# Patient Record
Sex: Female | Born: 1960 | Race: Black or African American | Hispanic: No | Marital: Married | Smoking: Never smoker
Health system: Southern US, Community
[De-identification: ages and names within clinical notes are randomized; demographics above are authoritative.]

## PROBLEM LIST (undated history)

## (undated) DIAGNOSIS — G35 Multiple sclerosis: Secondary | ICD-10-CM

## (undated) DIAGNOSIS — I10 Essential (primary) hypertension: Secondary | ICD-10-CM

## (undated) HISTORY — PX: ABDOMINAL HYSTERECTOMY: SHX81

---

## 2000-06-16 ENCOUNTER — Other Ambulatory Visit: Admission: RE | Admit: 2000-06-16 | Discharge: 2000-06-16 | Payer: Self-pay | Admitting: Obstetrics and Gynecology

## 2002-11-05 ENCOUNTER — Encounter: Payer: Self-pay | Admitting: Internal Medicine

## 2002-11-05 ENCOUNTER — Encounter: Admission: RE | Admit: 2002-11-05 | Discharge: 2002-11-05 | Payer: Self-pay | Admitting: Internal Medicine

## 2005-01-26 ENCOUNTER — Encounter: Admission: RE | Admit: 2005-01-26 | Discharge: 2005-01-26 | Payer: Self-pay | Admitting: Internal Medicine

## 2007-03-28 ENCOUNTER — Encounter: Admission: RE | Admit: 2007-03-28 | Discharge: 2007-03-28 | Payer: Self-pay | Admitting: Internal Medicine

## 2007-05-01 ENCOUNTER — Emergency Department (HOSPITAL_COMMUNITY): Admission: EM | Admit: 2007-05-01 | Discharge: 2007-05-01 | Payer: Self-pay | Admitting: Emergency Medicine

## 2011-09-23 ENCOUNTER — Other Ambulatory Visit: Payer: Self-pay | Admitting: Obstetrics and Gynecology

## 2011-09-23 DIAGNOSIS — Z1231 Encounter for screening mammogram for malignant neoplasm of breast: Secondary | ICD-10-CM

## 2011-10-12 ENCOUNTER — Ambulatory Visit
Admission: RE | Admit: 2011-10-12 | Discharge: 2011-10-12 | Disposition: A | Discharge status: Home / Self Care | Payer: 59 | Source: Ambulatory Visit | Attending: Obstetrics and Gynecology | Admitting: Obstetrics and Gynecology

## 2011-10-12 DIAGNOSIS — Z1231 Encounter for screening mammogram for malignant neoplasm of breast: Secondary | ICD-10-CM

## 2011-12-08 ENCOUNTER — Encounter: Payer: 59 | Admitting: Obstetrics and Gynecology

## 2012-09-11 ENCOUNTER — Encounter (HOSPITAL_COMMUNITY): Payer: Self-pay | Admitting: Emergency Medicine

## 2012-09-11 ENCOUNTER — Emergency Department (HOSPITAL_COMMUNITY)
Admission: EM | Admit: 2012-09-11 | Discharge: 2012-09-11 | Disposition: A | Discharge status: Home / Self Care | Payer: 59 | Attending: Emergency Medicine | Admitting: Emergency Medicine

## 2012-09-11 DIAGNOSIS — I10 Essential (primary) hypertension: Secondary | ICD-10-CM | POA: Insufficient documentation

## 2012-09-11 DIAGNOSIS — R51 Headache: Secondary | ICD-10-CM | POA: Insufficient documentation

## 2012-09-11 HISTORY — DX: Essential (primary) hypertension: I10

## 2012-09-11 HISTORY — DX: Multiple sclerosis: G35

## 2012-09-11 NOTE — ED Notes (Signed)
Pt states that she will just go to an UC.  Pt's BP was normal at triage as documented

## 2012-09-11 NOTE — ED Notes (Signed)
Pt complains of "my blood pressure has been elevated today". Pt also complains of a headache at this time.

## 2012-09-13 ENCOUNTER — Encounter: Payer: Self-pay | Admitting: *Deleted

## 2012-09-13 ENCOUNTER — Ambulatory Visit (INDEPENDENT_AMBULATORY_CARE_PROVIDER_SITE_OTHER): Payer: 59 | Admitting: Family Medicine

## 2012-09-13 ENCOUNTER — Encounter: Payer: Self-pay | Admitting: Family Medicine

## 2012-09-13 VITALS — BP 150/92 | HR 85 | Temp 98.8°F | Ht 67.0 in | Wt 216.0 lb

## 2012-09-13 DIAGNOSIS — G35 Multiple sclerosis: Secondary | ICD-10-CM

## 2012-09-13 DIAGNOSIS — I10 Essential (primary) hypertension: Secondary | ICD-10-CM

## 2012-09-13 DIAGNOSIS — Z79899 Other long term (current) drug therapy: Secondary | ICD-10-CM

## 2012-09-13 DIAGNOSIS — Z13220 Encounter for screening for lipoid disorders: Secondary | ICD-10-CM

## 2012-09-13 LAB — HEPATIC FUNCTION PANEL
Albumin: 4 g/dL (ref 3.5–5.2)
Total Bilirubin: 0.2 mg/dL — ABNORMAL LOW (ref 0.3–1.2)

## 2012-09-13 LAB — BASIC METABOLIC PANEL
BUN: 11 mg/dL (ref 6–23)
Calcium: 9.1 mg/dL (ref 8.4–10.5)
Creatinine, Ser: 1 mg/dL (ref 0.4–1.2)

## 2012-09-13 LAB — LDL CHOLESTEROL, DIRECT: Direct LDL: 110.5 mg/dL

## 2012-09-13 MED ORDER — LISINOPRIL-HYDROCHLOROTHIAZIDE 10-12.5 MG PO TABS: 1.0000 | ORAL_TABLET | Freq: Every day | ORAL | Status: DC

## 2012-09-13 NOTE — Progress Notes (Signed)
Nature conservation officer at [facility] [address] [place] [address] Phone: [phone] Fax: [phone]  Date:  09/13/2012   Name:  Brandi Baldwin   DOB:  1961/04/05   MRN:  [id number] Gender: female Age: 51 y.o.  PCP:  Hannah Beat, MD  Evaluating MD: Hannah Beat, MD   Chief Complaint: Establish Care   History of Present Illness:  Brandi Baldwin is a 51 y.o. pleasant patient who presents with the following:  Was on blood pressure meds before hctz/lisinopril.  She has run out of her medication, now her blood pressure has increased. She has not had any headaches, nausea, visual changes or other problems. She generally is feeling well.  She has not had any recent MS flares.  Dr. Izola Price in [place].    Patient Active Problem List  Diagnosis  . Hypertension  . Multiple sclerosis    Past Medical History  Diagnosis Date  . Hypertension   . Multiple sclerosis     Past Surgical History  Procedure Date  . Abdominal hysterectomy Partial    History  Substance Use Topics  . Smoking status: Never Smoker   . Smokeless tobacco: Not on file  . Alcohol Use: Yes    No family history on file.  No Known Allergies  Current Outpatient Prescriptions on File Prior to Visit  Medication Sig Dispense Refill  . baclofen (LIORESAL) 10 MG tablet Take 10 mg by mouth 2 (two) times daily.      . interferon beta-1a (REBIF REBIDOSE) 44 MCG/0.5ML injection Inject 44 mcg into the skin 3 (three) times a week.      Marland Kitchen VIVELLE-DOT 0.05 MG/24HR       . lisinopril-hydrochlorothiazide (PRINZIDE,ZESTORETIC) 10-12.5 MG per tablet Take 1 tablet by mouth daily.  30 tablet  11     Review of Systems:   GEN: No acute illnesses, no fevers, chills. GI: No n/v/d, eating normally Pulm: No SOB Interactive and getting along well at home.  Otherwise, ROS is as per the HPI.   Physical Examination: Filed Vitals:   09/13/12 1400  BP: 150/92  Pulse: 85  Temp:  98.8 F (37.1 C)  TempSrc: Oral  Height: 5\' 7"  (1.702 m)  Weight: 216 lb (97.977 kg)  SpO2: 98%    Body mass index is 33.83 kg/(m^2). Ideal Body Weight: Weight in (lb) to have BMI = 25: 159.3    GEN: WDWN, NAD, Non-toxic, A & O x 3 HEENT: Atraumatic, Normocephalic. Neck supple. No masses, No LAD. Ears and Nose: No external deformity. CV: RRR, No M/G/R. No JVD. No thrill. No extra heart sounds. PULM: CTA B, no wheezes, crackles, rhonchi. No retractions. No resp. distress. No accessory muscle use. EXTR: No c/c/e NEURO Normal gait.  PSYCH: Normally interactive. Conversant. Not depressed or anxious appearing.  Calm demeanor.    Assessment and Plan:  1. Multiple sclerosis  Ambulatory referral to Neurology  2. Hypertension    3. Screening for lipoid disorders  LDL cholesterol, direct  4. Encounter for long-term (current) use of other medications  Basic metabolic panel, Hepatic function panel   Consult [facility] for their long-term assistance with management with multiple sclerosis.  Start lisinopril/hydrochlorothiazide at the low dosing. 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 Basic metabolic panel  . LDL cholesterol,  direct  . Hepatic function panel  . Ambulatory referral to Neurology    Referral Priority:  Routine    Referral Type:  Consultation    Referral Reason:  Specialty Services Required    Requested Specialty:  Neurology    Number of Visits Requested:  1    Updated Medication List: (Includes new medications, updates to list, dose adjustments) Meds ordered this encounter  Medications  . VIVELLE-DOT 0.05 MG/24HR    Sig:   . aspirin 81 MG tablet    Sig: Take 81 mg by mouth daily.  Marland Kitchen lisinopril-hydrochlorothiazide (PRINZIDE,ZESTORETIC) 10-12.5 MG per tablet    Sig: Take 1 tablet by mouth daily.    Dispense:   30 tablet    Refill:  11    Medications Discontinued: There are no discontinued medications.   Hannah Beat, MD

## 2012-09-13 NOTE — Patient Instructions (Addendum)
REFERRAL: GO THE THE FRONT ROOM AT THE ENTRANCE OF OUR CLINIC, NEAR CHECK IN. ASK FOR MARION. SHE WILL HELP YOU SET UP YOUR REFERRAL. DATE: TIME:  

## 2012-09-15 ENCOUNTER — Encounter: Payer: Self-pay | Admitting: Family Medicine

## 2012-09-15 DIAGNOSIS — I10 Essential (primary) hypertension: Secondary | ICD-10-CM | POA: Insufficient documentation

## 2012-09-15 DIAGNOSIS — G35 Multiple sclerosis: Secondary | ICD-10-CM | POA: Insufficient documentation

## 2012-09-18 ENCOUNTER — Encounter: Payer: Self-pay | Admitting: Obstetrics and Gynecology

## 2012-09-18 ENCOUNTER — Ambulatory Visit (INDEPENDENT_AMBULATORY_CARE_PROVIDER_SITE_OTHER): Payer: 59 | Admitting: Obstetrics and Gynecology

## 2012-09-18 VITALS — BP 120/68 | HR 90 | Ht 65.5 in | Wt 212.0 lb

## 2012-09-18 DIAGNOSIS — Z90710 Acquired absence of both cervix and uterus: Secondary | ICD-10-CM | POA: Insufficient documentation

## 2012-09-18 DIAGNOSIS — N951 Menopausal and female climacteric states: Secondary | ICD-10-CM

## 2012-09-18 DIAGNOSIS — R3915 Urgency of urination: Secondary | ICD-10-CM

## 2012-09-18 DIAGNOSIS — Z01419 Encounter for gynecological examination (general) (routine) without abnormal findings: Secondary | ICD-10-CM

## 2012-09-18 DIAGNOSIS — R232 Flushing: Secondary | ICD-10-CM

## 2012-09-18 MED ORDER — ESTRADIOL 0.1 MG/GM VA CREA: TOPICAL_CREAM | VAGINAL | Status: DC

## 2012-09-18 NOTE — Progress Notes (Signed)
Subjective:  Last Pap: no longer needed WNL: n/a Regular Periods:no Contraception: partial hysterectomy   Monthly Breast exam:yes Tetanus<35yrs:yes Nl.Bladder Function:yes Daily BMs:yes Healthy Diet:no Calcium:no Mammogram:yes Date of Mammogram: 10/12/11 Exercise:no Have often Exercise: n/a Seatbelt: yes Abuse at home: no Stressful work:yes Sigmoid-colonoscopy: Pt has one scheduled  Bone Density: No PCP: Dr. Patsy Lager Change in PMH: none Change in ZOX:WRUE   Brandi Baldwin is a 51 y.o. female No obstetric history on file. who presents for annual exam.. She has chronic urinary urgency, but rarely has incontinence, thought to be a manifestation of Multiple Sclerosis.  Otherwise MS has been stable.  Hot flashes are improved with vivelle dot, but she wants to change meds because the patch doesn't stay in place  The following portions of the patient's history were reviewed and updated as appropriate: allergies, current medications, past family history, past medical history, past social history, past surgical history and problem list.  Review of Systems Pertinent items are noted in HPI. Gastrointestinal:No change in bowel habits, no abdominal pain, no rectal bleeding Genitourinary:negative for dysuria, frequency, hematuria, nocturia and urinary incontinence    Objective:     BP 120/68  Pulse 90  Ht 5' 5.5" (1.664 m)  Wt 212 lb (96.163 kg)  BMI 34.74 kg/m2  Weight:  Wt Readings from Last 1 Encounters:  09/18/12 212 lb (96.163 kg)     BMI: Body mass index is 34.74 kg/(m^2). General Appearance: Alert, appropriate appearance for age. No acute distress HEENT: Grossly normal Neck / Thyroid: Supple, no masses, nodes or enlargement Lungs: clear to auscultation bilaterally Back: No CVA tenderness Breast Exam: No masses or nodes.No dimpling, nipple retraction or discharge. Cardiovascular: Regular rate and rhythm. S1, S2, no murmur Gastrointestinal: Soft, non-tender, no masses or  organomegaly Pelvic Exam: External genitalia: normal general appearance Urinary system: urethral meatus normal Vaginal: normal rugae Cervix: removed surgically Adnexa: no masses Uterus: removed surgically Rectovaginal: normal rectal, no masses Lymphatic Exam: Non-palpable nodes in neck, clavicular, axillary, or inguinal regions Skin: no rash or abnormalities Neurologic: Normal gait and speech, no tremor  Psychiatric: Alert and oriented, appropriate affect.    Urinalysis:normal and Not done    Assessment:    Hormone replacement therapy MS with urinary urgency; r/o estrogen deficinecy    Plan:   mammogram return annually or prn Sample of Elestrin given to allow pt to see if hot flashes are well controled with this med.  Pt aware that insurance may not cover.  She will decide how she likes it an let us know if she wants it prescribed. rx Estradiol vaginal cream to try to improve urgency.   Dierdre Forth MD

## 2013-01-04 ENCOUNTER — Encounter: Payer: Self-pay | Admitting: Family Medicine

## 2013-09-18 ENCOUNTER — Other Ambulatory Visit: Payer: Self-pay | Admitting: Family Medicine

## 2013-11-23 ENCOUNTER — Other Ambulatory Visit: Payer: Self-pay | Admitting: Family Medicine

## 2013-11-24 NOTE — Telephone Encounter (Signed)
Last office visit 09/13/2012.  Ok to refill?

## 2013-12-24 ENCOUNTER — Other Ambulatory Visit: Payer: Self-pay | Admitting: Family Medicine

## 2013-12-24 NOTE — Telephone Encounter (Signed)
Ok to refill 30, 2 refills, f/u CPX

## 2013-12-24 NOTE — Telephone Encounter (Signed)
Last office 09/13/2012.  Ok to refill?

## 2014-03-21 ENCOUNTER — Other Ambulatory Visit: Payer: Self-pay | Admitting: Family Medicine

## 2014-03-28 ENCOUNTER — Other Ambulatory Visit: Payer: Self-pay

## 2014-03-28 NOTE — Telephone Encounter (Signed)
Pt left v/m requesting refill lisinopril/HCTZ 10-12.5 mg to [facility]. Pt has CPX scheduled 05/21/14 but last seen 09/13/2012.Please advise.

## 2014-03-30 MED ORDER — LISINOPRIL-HYDROCHLOROTHIAZIDE 10-12.5 MG PO TABS
ORAL_TABLET | ORAL | Status: DC
Start: ? — End: 2014-06-27

## 2014-05-07 ENCOUNTER — Other Ambulatory Visit: Payer: Self-pay | Admitting: Family Medicine

## 2014-05-07 DIAGNOSIS — R5383 Other fatigue: Secondary | ICD-10-CM

## 2014-05-07 DIAGNOSIS — R5381 Other malaise: Secondary | ICD-10-CM

## 2014-05-07 DIAGNOSIS — Z13220 Encounter for screening for lipoid disorders: Secondary | ICD-10-CM

## 2014-05-14 ENCOUNTER — Other Ambulatory Visit (INDEPENDENT_AMBULATORY_CARE_PROVIDER_SITE_OTHER): Payer: 59

## 2014-05-14 ENCOUNTER — Other Ambulatory Visit: Payer: 59

## 2014-05-14 DIAGNOSIS — Z13220 Encounter for screening for lipoid disorders: Secondary | ICD-10-CM

## 2014-05-14 DIAGNOSIS — E559 Vitamin D deficiency, unspecified: Secondary | ICD-10-CM

## 2014-05-14 DIAGNOSIS — R5383 Other fatigue: Secondary | ICD-10-CM

## 2014-05-14 DIAGNOSIS — R5381 Other malaise: Secondary | ICD-10-CM

## 2014-05-14 DIAGNOSIS — I10 Essential (primary) hypertension: Secondary | ICD-10-CM

## 2014-05-14 LAB — HEPATIC FUNCTION PANEL
ALBUMIN: 4.2 g/dL (ref 3.5–5.2)
ALK PHOS: 63 U/L (ref 39–117)
ALT: 18 U/L (ref 0–35)
AST: 19 U/L (ref 0–37)
BILIRUBIN TOTAL: 0.4 mg/dL (ref 0.2–1.2)
Bilirubin, Direct: 0 mg/dL (ref 0.0–0.3)
Total Protein: 7.7 g/dL (ref 6.0–8.3)

## 2014-05-14 LAB — LIPID PANEL
CHOL/HDL RATIO: 3
Cholesterol: 161 mg/dL (ref 0–200)
HDL: 49.9 mg/dL (ref >39.00)
LDL Cholesterol: 85 mg/dL (ref 0–99)
NONHDL: 111.1
Triglycerides: 130 mg/dL (ref 0.0–149.0)
VLDL: 26 mg/dL (ref 0.0–40.0)

## 2014-05-14 LAB — CBC WITH DIFFERENTIAL/PLATELET
BASOS ABS: 0.1 10*3/uL (ref 0.0–0.1)
Basophils Relative: 0.8 % (ref 0.0–3.0)
Eosinophils Absolute: 0.3 10*3/uL (ref 0.0–0.7)
Eosinophils Relative: 4.9 % (ref 0.0–5.0)
HCT: 37.6 % (ref 36.0–46.0)
HEMOGLOBIN: 12.6 g/dL (ref 12.0–15.0)
LYMPHS PCT: 49.1 % — AB (ref 12.0–46.0)
Lymphs Abs: 3.3 10*3/uL (ref 0.7–4.0)
MCHC: 33.5 g/dL (ref 30.0–36.0)
MCV: 90.4 fl (ref 78.0–100.0)
MONOS PCT: 6.1 % (ref 3.0–12.0)
Monocytes Absolute: 0.4 10*3/uL (ref 0.1–1.0)
NEUTROS ABS: 2.6 10*3/uL (ref 1.4–7.7)
Neutrophils Relative %: 39.1 % — ABNORMAL LOW (ref 43.0–77.0)
Platelets: 279 10*3/uL (ref 150.0–400.0)
RBC: 4.16 Mil/uL (ref 3.87–5.11)
RDW: 13.1 % (ref 11.5–15.5)
WBC: 6.8 10*3/uL (ref 4.0–10.5)

## 2014-05-14 LAB — TSH: TSH: 2.11 u[IU]/mL (ref 0.35–4.50)

## 2014-05-14 LAB — BASIC METABOLIC PANEL
BUN: 11 mg/dL (ref 6–23)
CHLORIDE: 103 meq/L (ref 96–112)
CO2: 26 meq/L (ref 19–32)
CREATININE: 1.1 mg/dL (ref 0.4–1.2)
Calcium: 9.3 mg/dL (ref 8.4–10.5)
GFR: 70.2 mL/min (ref >60.00)
GLUCOSE: 89 mg/dL (ref 70–99)
Potassium: 3.9 mEq/L (ref 3.5–5.1)
Sodium: 137 mEq/L (ref 135–145)

## 2014-05-14 LAB — VITAMIN D 25 HYDROXY (VIT D DEFICIENCY, FRACTURES): VITD: 30.58 ng/mL (ref 30.00–100.00)

## 2014-05-21 ENCOUNTER — Encounter: Payer: Self-pay | Admitting: Family Medicine

## 2014-05-21 ENCOUNTER — Ambulatory Visit (INDEPENDENT_AMBULATORY_CARE_PROVIDER_SITE_OTHER): Payer: 59 | Admitting: Family Medicine

## 2014-05-21 VITALS — BP 130/72 | HR 102 | Temp 98.8°F | Ht 65.5 in | Wt 202.5 lb

## 2014-05-21 DIAGNOSIS — Z0000 Encounter for general adult medical examination without abnormal findings: Secondary | ICD-10-CM

## 2014-05-21 DIAGNOSIS — G35 Multiple sclerosis: Secondary | ICD-10-CM

## 2014-05-21 DIAGNOSIS — Z23 Encounter for immunization: Secondary | ICD-10-CM

## 2014-05-21 MED ORDER — BACLOFEN 10 MG PO TABS: 10.0000 mg | ORAL_TABLET | Freq: Two times a day (BID) | ORAL | Status: DC

## 2014-05-21 NOTE — Progress Notes (Signed)
05/21/2014    ID: MEEYAH OVITT   MRN: [id number]  DOB: 1961/07/24  Primary Physician:  Owens Loffler, MD  Chief Complaint: Annual Exam  Subjective:   History of Present Illness:  Brandi Baldwin is a 53 y.o. pleasant patient who presents with the following:  Health Maintenance Summary Reviewed and updated, unless pt declines services.  Tobacco History Reviewed. Non-smoker Alcohol: No concerns, no excessive use Exercise Habits: Intermittent, but working a lot STD concerns: none Drug Use: None Birth control method: s/p hyst Menses regular: yes Lumps or breast concerns: no Breast Cancer Family History: no  Overall, she is doing well.  Health Maintenance  Topic Date Due  . Mammogram  10/11/2013  . Influenza Vaccine  05/10/2014  . Pap Smear  04/09/2017  . Colonoscopy  10/10/2022  . Tetanus/tdap  05/21/2024    Immunization History  Administered Date(s) Administered  . Tdap 05/21/2014   Patient Active Problem List   Diagnosis Date Noted  . Obesity (BMI 30-39.9) 05/22/2014  . S/P hysterectomy 09/18/2012  . Hypertension 09/15/2012  . Multiple sclerosis    Past Medical History  Diagnosis Date  . Hypertension   . Multiple sclerosis    Past Surgical History  Procedure Laterality Date  . Abdominal hysterectomy  Partial 1991   History   Social History  . Marital Status: Married    Spouse Name: N/A    Number of Children: N/A  . Years of Education: N/A   Occupational History  . Consulting civil engineer, RN.    Social History Main Topics  . Smoking status: Never Smoker   . Smokeless tobacco: Not on file  . Alcohol Use: Yes     Comment: socially   . Drug Use: No  . Sexual Activity: Yes    Birth Control/ Protection: Surgical     Comment: partial hysterectomy    Other Topics Concern  . Not on file   Social History Narrative   Licensed conveyancer for [employer]   No family history on file. Allergies  Allergen Reactions  . Amitriptyline    hallucinations    Medication list has been reviewed and updated. Outpatient Encounter Prescriptions as of 05/21/2014  Medication Sig  . aspirin 81 MG tablet Take 81 mg by mouth daily.  . baclofen (LIORESAL) 10 MG tablet Take 1 tablet (10 mg total) by mouth 2 (two) times daily.  Marland Kitchen estradiol (ESTRACE VAGINAL) 0.1 MG/GM vaginal cream 1 gram vaginally at bedtime 2 times weekly  . interferon beta-1a (REBIF REBIDOSE) 44 MCG/0.5ML injection Inject 44 mcg into the skin 3 (three) times a week.  Marland Kitchen lisinopril-hydrochlorothiazide (PRINZIDE,ZESTORETIC) 10-12.5 MG per tablet TAKE 1 TABLET BY MOUTH DAILY.  . valACYclovir (VALTREX) 1000 MG tablet Take 1 tablet by mouth daily.  Marland Kitchen VIVELLE-DOT 0.05 MG/24HR   . [DISCONTINUED] baclofen (LIORESAL) 10 MG tablet Take 10 mg by mouth 2 (two) times daily.    Review of Systems:  General: Denies fever, chills, sweats. No significant weight loss. Eyes: Denies blurring,significant itching ENT: Denies earache, sore throat, and hoarseness.  Cardiovascular: Denies chest pains, palpitations, dyspnea on exertion,  Respiratory: Denies cough, dyspnea at rest,wheeezing Breast: no concerns about lumps GI: Denies nausea, vomiting, diarrhea, constipation, change in bowel habits, abdominal pain, melena, hematochezia GU: Denies dysuria, hematuria, urinary hesitancy, nocturia, denies STD risk, no concerns about discharge Musculoskeletal: Denies back pain, joint pain Derm: Denies rash, itching Neuro: Denies  paresthesias, frequent falls, frequent headaches Psych: Denies depression, anxiety  Endocrine: Denies cold intolerance, heat intolerance, polydipsia Heme: Denies enlarged lymph nodes Allergy: No hayfever  Objective:   Physical Examination: BP 130/72  Pulse 102  Temp(Src) 98.8 F (37.1 C) (Oral)  Ht 5' 5.5" (1.664 m)  Wt 202 lb 8 oz (91.853 kg)  BMI 33.17 kg/m2  SpO2 98% No exam data present  GEN: well developed, well nourished, no acute distress Eyes:  conjunctiva and lids normal, PERRLA, EOMI ENT: TM clear, nares clear, oral exam WNL Neck: supple, no lymphadenopathy, no thyromegaly, no JVD Pulm: clear to auscultation and percussion, respiratory effort normal CV: regular rate and rhythm, S1-S2, no murmur, rub or gallop, no bruits Chest: no scars, masses, no lumps BREAST: breast exam declined GI: soft, non-tender; no hepatosplenomegaly, masses; active bowel sounds all quadrants GU: GU exam declined Lymph: no cervical, axillary or inguinal adenopathy MSK: gait normal, muscle tone and strength WNL, no joint swelling, effusions, discoloration, crepitus  SKIN: clear, good turgor, color WNL, no rashes, lesions, or ulcerations Neuro: normal mental status, normal strength, sensation, and motion Psych: alert; oriented to person, place and time, normally interactive and not anxious or depressed in appearance.   All labs reviewed with patient. Lipids:    Component Value Date/Time   CHOL 161 05/14/2014 0839   TRIG 130.0 05/14/2014 0839   HDL 49.90 05/14/2014 0839   LDLDIRECT 110.5 09/13/2012 1425   VLDL 26.0 05/14/2014 0839   CHOLHDL 3 05/14/2014 0839   CBC: CBC Latest Ref Rng 05/14/2014  WBC 4.0 - 10.5 K/uL 6.8  Hemoglobin 12.0 - 15.0 g/dL 12.6  Hematocrit 36.0 - 46.0 % 37.6  Platelets 150.0 - 400.0 K/uL 789.3    Basic Metabolic Panel:    Component Value Date/Time   NA 137 05/14/2014 0839   K 3.9 05/14/2014 0839   CL 103 05/14/2014 0839   CO2 26 05/14/2014 0839   BUN 11 05/14/2014 0839   CREATININE 1.1 05/14/2014 0839   GLUCOSE 89 05/14/2014 0839   CALCIUM 9.3 05/14/2014 0839   Hepatic Function Latest Ref Rng 05/14/2014 09/13/2012  Total Protein 6.0 - 8.3 g/dL 7.7 7.7  Albumin 3.5 - 5.2 g/dL 4.2 4.0  AST 0 - 37 U/L 19 25  ALT 0 - 35 U/L 18 25  Alk Phosphatase 39 - 117 U/L 63 70  Total Bilirubin 0.2 - 1.2 mg/dL 0.4 0.2(L)  Bilirubin, Direct 0.0 - 0.3 mg/dL 0.0 0.0    Lab Results  Component Value Date   TSH 2.11 05/14/2014   No results  found.  Assessment & Plan:   Multiple sclerosis - Plan: Ambulatory referral to Neurology  Need for Tdap vaccination - Plan: Tdap vaccine greater than or equal to 7yo IM  Health Maintenance Exam: The patient's preventative maintenance and recommended screening tests for an annual wellness exam were reviewed in full today. Brought up to date unless services declined.  Counselled on the importance of diet, exercise, and its role in overall health and mortality. The patient's FH and SH was reviewed, including their home life, tobacco status, and drug and alcohol status.  She stopped her MS medications - needs new long-term neurologist.   Follow-up: No Follow-up on file. Or follow-up in 1 year for complete physical examination  Modified Medications   Modified Medication Previous Medication   BACLOFEN (LIORESAL) 10 MG TABLET baclofen (LIORESAL) 10 MG tablet      Take 1 tablet (10 mg total) by mouth 2 (two) times daily.    Take 10 mg by mouth 2 (two)  times daily.   Orders Placed This Encounter  Procedures  . Tdap vaccine greater than or equal to 7yo IM  . Ambulatory referral to Neurology    Signed,  Frederico Hamman T. Jacobus Colvin, MD, [facility] [place], [address] Phone: [phone] Fax: [phone]

## 2014-05-21 NOTE — Patient Instructions (Signed)

## 2014-05-21 NOTE — Progress Notes (Signed)
Pre visit review using our clinic review tool, if applicable. No additional management support is needed unless otherwise documented below in the visit note. 

## 2014-05-22 ENCOUNTER — Encounter: Payer: Self-pay | Admitting: Family Medicine

## 2014-05-22 DIAGNOSIS — E669 Obesity, unspecified: Secondary | ICD-10-CM | POA: Insufficient documentation

## 2014-06-27 ENCOUNTER — Other Ambulatory Visit: Payer: Self-pay | Admitting: Family Medicine

## 2014-07-09 ENCOUNTER — Encounter: Payer: Self-pay | Admitting: Neurology

## 2014-07-09 ENCOUNTER — Ambulatory Visit (INDEPENDENT_AMBULATORY_CARE_PROVIDER_SITE_OTHER): Payer: 59 | Admitting: Neurology

## 2014-07-09 VITALS — BP 110/78 | HR 80 | Ht 66.54 in | Wt 204.4 lb

## 2014-07-09 DIAGNOSIS — N318 Other neuromuscular dysfunction of bladder: Secondary | ICD-10-CM

## 2014-07-09 DIAGNOSIS — M62838 Other muscle spasm: Secondary | ICD-10-CM

## 2014-07-09 DIAGNOSIS — G35 Multiple sclerosis: Secondary | ICD-10-CM | POA: Insufficient documentation

## 2014-07-09 DIAGNOSIS — N3281 Overactive bladder: Secondary | ICD-10-CM | POA: Insufficient documentation

## 2014-07-09 MED ORDER — FESOTERODINE FUMARATE ER 4 MG PO TB24: 4.0000 mg | ORAL_TABLET | Freq: Every day | ORAL | Status: DC

## 2014-07-09 MED ORDER — BACLOFEN 10 MG PO TABS: 10.0000 mg | ORAL_TABLET | Freq: Three times a day (TID) | ORAL | Status: DC

## 2014-07-09 NOTE — Progress Notes (Signed)
NEUROLOGY CONSULTATION NOTE  Caro HightDebbie J Reno MRN: [id number] DOB: 01-13-1961  Referring provider: Dr. Patsy Lageropland Primary care provider: Dr. Patsy Lageropland  Reason for consult:  MS  HISTORY OF PRESENT ILLNESS: Tedra SenegalDebbie Sole is a 53 year old right-handed woman with history of multiple sclerosis and hypertension who presents to establish care regarding multiple sclerosis.  There are no records available to review at this time.  In 2009-2010, she developed an episode of vertigo lasting for 2 days, which spontaneously resolved.  In 2011, she developed sudden onset blindness in the left eye, associated with eye pain.  Soon afterwards, she developed sudden onset double vision, associated with incoordinating, gait instability and weakness of the left leg.  She was concerned about a stroke, so she went to an outside hospital, where MRI of the brain was reportedly suspicious for demyelinating disease.  MRI of the cervical spine was reportedly unremarkable.  She underwent further workup, including checking for HIV, which was negative.  She did have an LP, but is not sure of the results.  She was treated with Solumedrol.  She [place] up with [place] neurolog[place] [facility].  He told her that it is not definite that she has MS.  A disease-modifying agent was not started at that time.    Since that time, she has residual symptoms of unsteadiness, sometimes her left leg gives out.  She also has painful muscle spasms in the toes and calves.  She does feel fatigued, although it is usually not debilitating.  She has had to stay home from work a couple of times due to the fatigue.  She does have an overactive bladder and has had episodes of urinary incontinence on occasion.  She occasionally has neck tightness that causes aching pain.  She notes that her fingers sometimes move unintentionally, but she is able to suppress it when she is aware it is happening.  Symptoms do not seem exacerbated particular time of  year.  She subsequently moved to [place] in 2012, where she had exacerbation of the unsteadiness and left leg weakness.  She established care with a neurologist there.  She received another round of Solumedrol and was then started her on Rebif.  She has not required further steroids.  [place] eventually [place] back [place] and has been off Rebif for 6 months.  Currently, she takes Rebif as a disease-modifying agent and baclofen 10mg  twice daily for muscle spasms, which is still a problem.  Other medications include aspirin 81mg , estradiol, Lisinopril-hydrochlorothiazide, and valacyclovir.  Prior medications included Tegretol for spasms, which only caused sleepiness.  She was previously on vitamin D for vitamin D deficiency.  She has no family history of MS.  She is a Engineer, civil (consulting)nurse.  05/14/14 LABS:  Vit D 25-hydroxy 30.58, WBC 6.8, HGB 12.6, HCT 37.6, PLT 279, Na 137, K 3.9, Cl 103, CO2 26, glucose 89, BUN 11, Cr 1.1, Ca 9.3, TB 0.4, DB 0, ALP 63, AST 19, ALT 18, TP 7.7, and albumin 4.2.  PAST MEDICAL HISTORY: Past Medical History  Diagnosis Date  . Hypertension   . Multiple sclerosis     PAST SURGICAL HISTORY: Past Surgical History  Procedure Laterality Date  . Abdominal hysterectomy  Partial 1991    MEDICATIONS: Current Outpatient Prescriptions on File Prior to Visit  Medication Sig Dispense Refill  . aspirin 81 MG tablet Take 81 mg by mouth daily.      Marland Kitchen. estradiol (ESTRACE VAGINAL) 0.1 MG/GM vaginal cream 1 gram vaginally  at bedtime 2 times weekly  42.5 g  12  . interferon beta-1a (REBIF REBIDOSE) 44 MCG/0.5ML injection Inject 44 mcg into the skin 3 (three) times a week.      Marland Kitchen lisinopril-hydrochlorothiazide (PRINZIDE,ZESTORETIC) 10-12.5 MG per tablet TAKE 1 TABLET BY MOUTH DAILY.  30 tablet  5  . valACYclovir (VALTREX) 1000 MG tablet Take 1 tablet by mouth daily.      Marland Kitchen VIVELLE-DOT 0.05 MG/24HR        No current facility-administered medications on file prior to visit.     ALLERGIES: Allergies  Allergen Reactions  . Amitriptyline     hallucinations    FAMILY HISTORY: Family History  Problem Relation Age of Onset  . Kidney disease Mother   . Hypertension Mother   . Cancer Brother     SOCIAL HISTORY: History   Social History  . Marital Status: Married    Spouse Name: N/A    Number of Children: N/A  . Years of Education: N/A   Occupational History  . Probation officer, RN.    Social History Main Topics  . Smoking status: Never Smoker   . Smokeless tobacco: Not on file  . Alcohol Use: Yes     Comment: socially   . Drug Use: No  . Sexual Activity: Yes    Birth Control/ Protection: Surgical     Comment: partial hysterectomy    Other Topics Concern  . Not on file   Social History Narrative   Consulting civil engineer for [employer]      GYN Care at [facility]             REVIEW OF SYSTEMS: Constitutional: Fatigue.  No fevers, chills, or sweats, change in appetite Eyes: No visual changes, double vision, eye pain Ear, nose and throat: No hearing loss, ear pain, nasal congestion, sore throat Cardiovascular: No chest pain, palpitations Respiratory:  No shortness of breath at rest or with exertion, wheezes GastrointestinaI: No nausea, vomiting, diarrhea, abdominal pain, fecal incontinence Genitourinary:  Increase urinary frequency. Musculoskeletal:  Muscle spasms in the calves and toes.  No neck pain, back pain Integumentary: No rash, pruritus, skin lesions Neurological: as above Psychiatric: No depression, insomnia, anxiety Endocrine: No palpitations, fatigue, diaphoresis, mood swings, change in appetite, change in weight, increased thirst Hematologic/Lymphatic:  No anemia, purpura, petechiae. Allergic/Immunologic: no itchy/runny eyes, nasal congestion, recent allergic reactions, rashes  PHYSICAL EXAM: Filed Vitals:   07/09/14 0749  BP: 110/78  Pulse: 80   General: No acute distress Head:   Normocephalic/atraumatic Neck: supple, no paraspinal tenderness, full range of motion Back: No paraspinal tenderness Heart: regular rate and rhythm Lungs: Clear to auscultation bilaterally. Vascular: No carotid bruits. Neurological Exam: Mental status: alert and oriented to person, place, and time, recent and remote memory intact, fund of knowledge intact, attention and concentration intact, speech fluent and not dysarthric, language intact. Cranial nerves: CN I: not tested CN II: pupils equal, round and reactive to light, visual fields intact, fundi unremarkable, without vessel changes, exudates, hemorrhages or papilledema. CN III, IV, VI:  full range of motion, no nystagmus, no ptosis CN V: endorses mild reduced sensation left V1-V3 CN VII: upper and lower face symmetric CN VIII: hearing intact CN IX, X: gag intact, uvula midline CN XI: sternocleidomastoid and trapezius muscles intact CN XII: tongue midline Bulk & Tone: normal, no fasciculations. Motor: 5-/5 left deltoid. Sensation: pinprick and vibration intact Deep Tendon Reflexes: 2+ throughout, toes downgoing Finger to  nose testing: with trace past pointing on the left. Heel to shin: no dysmetria Gait: mildly unsteady.  Stumbles a bit when she turns around.  Difficulty tandem walking. Romberg negative.  IMPRESSION: Based on clinical history, I suspect relapsing-remitting MS.  It sounds like she has supratentorial white matter lesions.  Although that alone is not radiologically diagnostic for MS, she has had clinical episodes suggestive of infratentorial involvement (such as optic neuritis).  PLAN: 1.  We will really need to get her prior notes for review. 2.   We will get you back on the Rebif 3.  I will start you on Toviaz 4mg  daily for overactive bladder 4.  Increase baclofen to 10mg  three times daily  5.  Start D3 2000 units daily 6.  Get notes from your old neurologists 7.  Check new baseline MRI of brain and cervical  spine with and without contrast [facility]  [place] [facility]  07/14/14 7:30pm [phone] 8.  Follow up in 39months.  Thank you for allowing me to take part in the care of this patient.  Shon Millet, DO  CC: Hannah Beat, MD

## 2014-07-09 NOTE — Patient Instructions (Addendum)
Based on the history you are relating to me, I do think you have relapsing remitting MS.   1.  We will get you back on the Rebif 2.  I will start you on Toviaz 4mg  daily for overactive bladder 3.  Increase baclofen to 10mg  three times daily  4.  Start D3 2000 units daily 5.  Get notes from your old neurologists 6.  Check new baseline MRI of brain and cervical spine with and without contrast [facility]  [place] [facility]  07/14/14 7:30pm [phone] 7.  Follow up in 3months.

## 2014-07-14 ENCOUNTER — Ambulatory Visit
Admission: RE | Admit: 2014-07-14 | Discharge: 2014-07-14 | Disposition: A | Discharge status: Home / Self Care | Payer: 59 | Source: Ambulatory Visit | Attending: Neurology | Admitting: Neurology

## 2014-07-14 DIAGNOSIS — M62838 Other muscle spasm: Secondary | ICD-10-CM

## 2014-07-14 DIAGNOSIS — G35 Multiple sclerosis: Secondary | ICD-10-CM

## 2014-07-14 DIAGNOSIS — N3281 Overactive bladder: Secondary | ICD-10-CM

## 2014-07-14 MED ORDER — GADOBENATE DIMEGLUMINE 529 MG/ML IV SOLN
19.0000 mL | Freq: Once | INTRAVENOUS | Status: AC | PRN
  Administered 2014-07-14: 19 mL via INTRAVENOUS

## 2014-07-16 ENCOUNTER — Telehealth: Payer: Self-pay | Admitting: *Deleted

## 2014-07-16 NOTE — Telephone Encounter (Signed)
For patient to call office to schedule an appt

## 2014-07-25 ENCOUNTER — Other Ambulatory Visit: Payer: Self-pay

## 2014-07-28 ENCOUNTER — Telehealth: Payer: Self-pay | Admitting: Neurology

## 2014-07-28 ENCOUNTER — Ambulatory Visit: Payer: 59 | Admitting: Neurology

## 2014-07-28 NOTE — Telephone Encounter (Signed)
appt marked as a no show since pt called the day of to cancel but a no show letter will not be sent / Oneita KrasSherri S

## 2014-07-28 NOTE — Telephone Encounter (Signed)
Pt called at 8:38AM to apologize for not showing up for her 07/28/14 appt. She stated that she over slept.  Pt r/s to 08/08/14 at 8:45AM. C/b [phone]

## 2014-08-08 ENCOUNTER — Encounter: Payer: Self-pay | Admitting: Neurology

## 2014-08-08 ENCOUNTER — Ambulatory Visit (INDEPENDENT_AMBULATORY_CARE_PROVIDER_SITE_OTHER): Payer: 59 | Admitting: Neurology

## 2014-08-08 VITALS — BP 118/70 | HR 88 | Temp 98.5°F | Resp 16 | Wt 206.0 lb

## 2014-08-08 DIAGNOSIS — G35 Multiple sclerosis: Secondary | ICD-10-CM

## 2014-08-08 NOTE — Patient Instructions (Addendum)
Based on your MRI, I cannot say you have MS.  I still need the results and notes from [place].  But I would like to refer you to [facility] for evaluation.  You may follow up with me afterwards.

## 2014-08-08 NOTE — Progress Notes (Signed)
NEUROLOGY FOLLOW UP OFFICE NOTE  Brandi Baldwin [id number]  HISTORY OF PRESENT ILLNESS: Brandi Baldwin is a 53 year old right-handed woman with history of multiple sclerosis and hypertension who follows up to discuss her history of multiple sclerosis.    UPDATE: She had MRI of the brain and cervical spine performed on 07/15/14.  The brain and spinal cord revealed no evidence for demyelinating lesions.  She does have mild to moderate central canal stenosis at C4-5, C5-6 and C6-7, with some deformity of the ventral cord at C4-5 and C5-6.  HISTORY: In 2009-2010, she developed an episode of vertigo lasting for 2 days, which spontaneously resolved.  In 2011, she developed sudden onset blindness in the left eye, associated with eye pain.  Soon afterwards, she developed sudden onset double vision, associated with incoordinating, gait instability and weakness of the left leg.  She was concerned about a stroke, so she went to an outside hospital, where MRI of the brain was reportedly suspicious for demyelinating disease.  MRI of the cervical spine was reportedly unremarkable.  She underwent further workup, including checking for HIV, which was negative.  She did have an LP, but is not sure of the results.  She was treated with Solumedrol.  She followed up with a neurologist, Dr. Manson Passey, in [facility].  He told her that it is not definite that she has MS.  A disease-modifying agent was not started at that time.    Since that time, she has residual symptoms of unsteadiness, sometimes her left leg gives out.  She also has painful muscle spasms in the toes and calves.  She does feel fatigued, although it is usually not debilitating.  She has had to stay home from work a couple of times due to the fatigue.  She does have an overactive bladder and has had episodes of urinary incontinence on occasion.  She occasionally has neck tightness that causes aching pain.  She notes that her fingers sometimes move  unintentionally, but she is able to suppress it when she is aware it is happening.  Symptoms do not seem exacerbated particular time of year.  She subsequently moved to [place] in 2012, where she had exacerbation of the unsteadiness and left leg weakness.  She established care with a neurologist there.  She received another round of Solumedrol and was then started her on Rebif.  She has not required further steroids.  She eventually moved back to [place] and has been off Rebif for 6 months.  Currently, she takes Rebif as a disease-modifying agent and baclofen 10mg  twice daily for muscle spasms, which is still a problem.  Other medications include aspirin 81mg , estradiol, Lisinopril-hydrochlorothiazide, and valacyclovir.  Prior medications included Tegretol for spasms, which only caused sleepiness.  She was previously on vitamin D for vitamin D deficiency.  She has no family history of MS.  She is a Engineer, civil (consulting).  05/14/14 LABS:  Vit D 25-hydroxy 30.58, WBC 6.8, HGB 12.6, HCT 37.6, PLT 279, Na 137, K 3.9, Cl 103, CO2 26, glucose 89, BUN 11, Cr 1.1, Ca 9.3, TB 0.4, DB 0, ALP 63, AST 19, ALT 18, TP 7.7, and albumin 4.2.  Endorses mild reduced sensation in left V1-V3 distribution.  Otherwise, CN II-XII intact.  Bulk and tone normal.  Muscle strength 5-/5 in left deltoid, otherwise 5/5.  Temperature and vibration sensation intact.  Deep tendon reflexes 2+ throughout, toes downgoing.  Finger to nose intact, except for trace past pointing on the left.  Gait mildly unsteady.  Romberg negative.  PAST MEDICAL HISTORY: Past Medical History  Diagnosis Date  . Hypertension   . Multiple sclerosis     MEDICATIONS: Current Outpatient Prescriptions on File Prior to Visit  Medication Sig Dispense Refill  . aspirin 81 MG tablet Take 81 mg by mouth daily.      . baclofen (LIORESAL) 10 MG tablet Take 1 tablet (10 mg total) by mouth 3 (three) times daily.  90 each  11  . estradiol (ESTRACE VAGINAL) 0.1 MG/GM  vaginal cream 1 gram vaginally at bedtime 2 times weekly  42.5 g  12  . fesoterodine (TOVIAZ) 4 MG TB24 tablet Take 1 tablet (4 mg total) by mouth daily.  30 tablet  3  . interferon beta-1a (REBIF REBIDOSE) 44 MCG/0.5ML injection Inject 44 mcg into the skin 3 (three) times a week.      Marland Kitchen lisinopril-hydrochlorothiazide (PRINZIDE,ZESTORETIC) 10-12.5 MG per tablet TAKE 1 TABLET BY MOUTH DAILY.  30 tablet  5  . valACYclovir (VALTREX) 1000 MG tablet Take 1 tablet by mouth daily.      Marland Kitchen VIVELLE-DOT 0.05 MG/24HR        No current facility-administered medications on file prior to visit.    ALLERGIES: Allergies  Allergen Reactions  . Amitriptyline     hallucinations    FAMILY HISTORY: Family History  Problem Relation Age of Onset  . Kidney disease Mother   . Hypertension Mother   . Cancer Brother     SOCIAL HISTORY: History   Social History  . Marital Status: Married    Spouse Name: N/A    Number of Children: N/A  . Years of Education: N/A   Occupational History  . Probation officer, RN.    Social History Main Topics  . Smoking status: Never Smoker   . Smokeless tobacco: Not on file  . Alcohol Use: Yes     Comment: socially   . Drug Use: No  . Sexual Activity: Yes    Partners: Male    Birth Control/ Protection: Surgical     Comment: partial hysterectomy    Other Topics Concern  . Not on file   Social History Narrative   Consulting civil engineer for [employer]      GYN Care at [facility]             REVIEW OF SYSTEMS: Constitutional: No fevers, chills, or sweats, no generalized fatigue, change in appetite Eyes: No visual changes, double vision, eye pain Ear, nose and throat: No hearing loss, ear pain, nasal congestion, sore throat Cardiovascular: No chest pain, palpitations Respiratory:  No shortness of breath at rest or with exertion, wheezes GastrointestinaI: No nausea, vomiting, diarrhea, abdominal pain, fecal incontinence Genitourinary:  No  dysuria, urinary retention or frequency Musculoskeletal:  No neck pain, back pain Integumentary: No rash, pruritus, skin lesions Neurological: as above Psychiatric: No depression, insomnia, anxiety Endocrine: No palpitations, fatigue, diaphoresis, mood swings, change in appetite, change in weight, increased thirst Hematologic/Lymphatic:  No anemia, purpura, petechiae. Allergic/Immunologic: no itchy/runny eyes, nasal congestion, recent allergic reactions, rashes  PHYSICAL EXAM: Filed Vitals:   08/08/14 0904  BP: 118/70  Pulse: 88  Temp: 98.5 F (36.9 C)  Resp: 16   General: No acute distress Head:  Normocephalic/atraumatic  IMPRESSION: History of MS.  Based on these findings, I don't think she has MS.  Looking back at her history, she may have had at least an isolated attack, which could have  been a clinically isolated syndrome.  I still have not received the notes from [place].  But at this point, I do not feel comfortable prescribing the Rebif at this time without reviewing her complete records.  PLAN: 1.  Patient said she will call today for the records 2.  Referral to the [facility] center at [facility] for consult  15 minutes spent with patient discussing results, 100% spent coordinating care.  Shon MilletAdam Jaffe, DO  CC:  Hannah BeatSpencer Copland, MD

## 2014-08-11 ENCOUNTER — Telehealth: Payer: Self-pay | Admitting: *Deleted

## 2014-08-11 ENCOUNTER — Encounter: Payer: Self-pay | Admitting: Neurology

## 2014-08-11 ENCOUNTER — Other Ambulatory Visit: Payer: Self-pay | Admitting: *Deleted

## 2014-08-11 NOTE — Addendum Note (Signed)
Addended by: Glean Salen E on: 08/11/2014 09:31 AM   Modules accepted: Orders

## 2014-08-11 NOTE — Telephone Encounter (Signed)
Patient is aware of  appt at [facility] clinic [facility] 10/14/14 at 12:45pm

## 2014-10-07 ENCOUNTER — Telehealth: Payer: Self-pay | Admitting: *Deleted

## 2014-10-07 NOTE — Telephone Encounter (Signed)
See below note.

## 2014-10-07 NOTE — Telephone Encounter (Signed)
Patient called to cancel her follow up appointment she has not been seen at [facility] yet will call and reschedule once she has had this appointment

## 2014-10-08 ENCOUNTER — Ambulatory Visit: Payer: 59 | Admitting: Neurology

## 2014-11-05 ENCOUNTER — Ambulatory Visit (INDEPENDENT_AMBULATORY_CARE_PROVIDER_SITE_OTHER): Payer: BLUE CROSS/BLUE SHIELD | Admitting: Family Medicine

## 2014-11-05 ENCOUNTER — Encounter: Payer: Self-pay | Admitting: Family Medicine

## 2014-11-05 VITALS — BP 120/68 | HR 94 | Temp 98.4°F | Ht 65.5 in | Wt 219.2 lb

## 2014-11-05 DIAGNOSIS — M65852 Other synovitis and tenosynovitis, left thigh: Secondary | ICD-10-CM

## 2014-11-05 DIAGNOSIS — M545 Low back pain, unspecified: Secondary | ICD-10-CM

## 2014-11-05 DIAGNOSIS — M76892 Other specified enthesopathies of left lower limb, excluding foot: Secondary | ICD-10-CM

## 2014-11-05 MED ORDER — TIZANIDINE HCL 4 MG PO TABS: 4.0000 mg | ORAL_TABLET | Freq: Every evening | ORAL | Status: DC

## 2014-11-05 NOTE — Progress Notes (Signed)
Pre visit review using our clinic review tool, if applicable. No additional management support is needed unless otherwise documented below in the visit note. 

## 2014-11-05 NOTE — Progress Notes (Signed)
Dr. Karleen Hampshire T. Kamorie Aldous, MD, CAQ Sports Medicine Primary [facility] [address] [place], [address] Phone: [phone] Fax: [phone]  11/05/2014  Patient: Brandi Baldwin, MRN: [id number], DOB: May 11, 1961, 54 y.o.  Primary Physician:  Hannah Beat, MD  Chief Complaint: Back Pain and Groin Pain  Subjective:   Brandi Baldwin is a 54 y.o. 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  After this, she developed some pain in her back, more in the LEFT lower back and in the lower buttocks region.  She also has some pain in her anterior hip and in her hip flexor region.  It has been improving a little bit since then, and today it is better compared to yesterday.  Left groin has been hurting - moving residents. Employees could not come in.   Past Medical History, Surgical History, Social History, Family History, Problem List, Medications, and Allergies have been reviewed and updated if relevant.  GEN: No fevers, chills. Nontoxic. Primarily MSK c/o today. MSK: Detailed in the HPI GI: tolerating PO intake without difficulty Neuro: No numbness, parasthesias, or tingling associated. Otherwise the pertinent positives of the ROS are noted above.   Objective:   BP 120/68 mmHg  Pulse 94  Temp(Src) 98.4 F (36.9 C) (Oral)  Ht 5' 5.5" (1.664 m)  Wt 219 lb 4 oz (99.451 kg)  BMI 35.92 kg/m2   GEN: WDWN, NAD, Non-toxic, Alert & Oriented x 3 HEENT: Atraumatic, Normocephalic.  Ears and Nose: No external deformity. EXTR: No clubbing/cyanosis/edema NEURO: Normal gait.  PSYCH: Normally interactive. Conversant. Not depressed or anxious appearing.  Calm demeanor.   HIP EXAM: SIDE: L ROM: Abduction, Flexion, Internal and External range of motion: full Pain with terminal IROM and EROM:  no GTB: NT SLR: NEG Knees: No effusion FABER: mild posterior pain REVERSE FABER: NT, neg Piriformis: NT at direct palpation Str: flexion: 4/5, some pain, tender at proximal hip flexor insertion. abduction: 5/5 adduction: 5/5  Full range of motion at the back.  Normal extension.  4 flexion is normal.  Lateral bending is normal.  Rotation is normal.  Mild tenderness in the lower back, mostly around the paraspinal muscles from L4-5 and S1.  She also has some tenderness in her LEFT buttocks region.  There is also some tenderness in bilateral SI joints, more on the LEFT.  Neurovascularly intact throughout.  Radiology: No results found.  Assessment and Plan:   Left-sided low back pain without sciatica  Hip flexor tendinitis, left  Likely secondary to muscle strain given increased load and activity lifting patients.  Relative rest the next couple weeks.  Anti-inflammatories, gentle stretching, warmth, massage, muscle relaxant if needed at nighttime.  Alleve 2 tabs by mouth two times a day over the counter: Take at least for 2 - 3 weeks. This is equal to a prescripton strength dose (GENERIC CHEAPER EQUIVALENT IS NAPROXEN SODIUM)   New Prescriptions   TIZANIDINE (ZANAFLEX) 4 MG TABLET    Take 1 tablet (4 mg total) by mouth Nightly.   No orders of the defined types were placed in this encounter.    Signed,  Elpidio Galea. Kyleeann Cremeans, MD   Patient's Medications  New Prescriptions   TIZANIDINE (ZANAFLEX) 4 MG TABLET    Take 1 tablet (4 mg total) by  mouth Nightly.  Previous Medications   ASPIRIN 81 MG TABLET    Take 81 mg by mouth daily.   BACLOFEN (LIORESAL) 10 MG TABLET    Take 1 tablet (10 mg total) by mouth 3 (three) times daily.   ESTRADIOL (ESTRACE VAGINAL) 0.1 MG/GM VAGINAL CREAM    1 gram vaginally at bedtime 2 times weekly   ESTRADIOL (ESTRACE) 1 MG TABLET    Take 1 mg by mouth daily.   INTERFERON BETA-1A (REBIF REBIDOSE) 44 MCG/0.5ML INJECTION    Inject 44 mcg into the skin 3  (three) times a week.   LISINOPRIL-HYDROCHLOROTHIAZIDE (PRINZIDE,ZESTORETIC) 10-12.5 MG PER TABLET    TAKE 1 TABLET BY MOUTH DAILY.   VALACYCLOVIR (VALTREX) 1000 MG TABLET    Take 1 tablet by mouth daily.  Modified Medications   No medications on file  Discontinued Medications   FESOTERODINE (TOVIAZ) 4 MG TB24 TABLET    Take 1 tablet (4 mg total) by mouth daily.   VIVELLE-DOT 0.05 MG/24HR

## 2014-12-12 ENCOUNTER — Other Ambulatory Visit: Payer: Self-pay | Admitting: Family Medicine

## 2015-02-01 ENCOUNTER — Other Ambulatory Visit: Payer: Self-pay | Admitting: Family Medicine

## 2015-02-01 NOTE — Telephone Encounter (Signed)
Last office visit 11/05/2014  Last refilled 11/05/2014 for #30 with 2 refills.  Ok to refill?

## 2015-03-16 ENCOUNTER — Telehealth: Payer: Self-pay | Admitting: *Deleted

## 2015-03-16 NOTE — Telephone Encounter (Signed)
Mammogram f/u call: pt doesn't have recent mammogram on file it's been over 2 years. Called pt and no answer so left voicemail requesting pt to call office. I need to see if she has had a recent mammogram, has one scheduled, needs to schedule one or refuses to get one

## 2015-03-18 NOTE — Telephone Encounter (Signed)
Pt let v/m requesting cb [phone].

## 2015-04-04 ENCOUNTER — Other Ambulatory Visit: Payer: Self-pay | Admitting: Family Medicine

## 2015-04-06 NOTE — Telephone Encounter (Signed)
Last office visit 11/04/2014.  Last refilled 02/02/2015 for #30 with 2 refills.  Ok to refill?

## 2015-05-05 ENCOUNTER — Telehealth: Payer: Self-pay

## 2015-05-05 NOTE — Telephone Encounter (Signed)
Called to notify patient of being due for a Mammogram. Patient would like to schedule an appointment with [facility] at [facility]. I provided them with the contact information, and patient stated that she would call and set up an appointment. Patient also requested a letter to be mailed to her with [facility] contact information as well. Letter placed up front to be sent.

## 2015-05-14 ENCOUNTER — Other Ambulatory Visit: Payer: Self-pay | Admitting: Family Medicine

## 2015-06-18 ENCOUNTER — Other Ambulatory Visit: Payer: Self-pay | Admitting: Family Medicine

## 2015-06-18 NOTE — Telephone Encounter (Signed)
Last office visit 11/05/2014.  Last CPE 05/21/2014.  Last refilled 07/09/2014 for #90 with 11 refills.  Ok to refill?

## 2015-09-18 ENCOUNTER — Other Ambulatory Visit: Payer: Self-pay | Admitting: Family Medicine

## 2015-09-18 NOTE — Telephone Encounter (Signed)
Last office visit 11/05/2014.  Last refilled 05/14/2015 for #30 with 2 refills.  Ok to refill?

## 2015-11-25 ENCOUNTER — Other Ambulatory Visit: Payer: Self-pay | Admitting: Obstetrics and Gynecology

## 2015-11-25 DIAGNOSIS — R928 Other abnormal and inconclusive findings on diagnostic imaging of breast: Secondary | ICD-10-CM

## 2015-11-30 ENCOUNTER — Ambulatory Visit
Admission: RE | Admit: 2015-11-30 | Discharge: 2015-11-30 | Disposition: A | Discharge status: Home / Self Care | Payer: Managed Care, Other (non HMO) | Source: Ambulatory Visit | Attending: Obstetrics and Gynecology | Admitting: Obstetrics and Gynecology

## 2015-11-30 DIAGNOSIS — R928 Other abnormal and inconclusive findings on diagnostic imaging of breast: Secondary | ICD-10-CM

## 2015-12-18 ENCOUNTER — Telehealth: Payer: Self-pay | Admitting: Family Medicine

## 2015-12-18 ENCOUNTER — Other Ambulatory Visit: Payer: Self-pay | Admitting: Family Medicine

## 2015-12-18 NOTE — Telephone Encounter (Signed)
Last office visit 11/05/2014.  No future appointments scheduled.  Refill?

## 2015-12-18 NOTE — Telephone Encounter (Signed)
Last office visit 11/04/2014.  Last refilled 09/18/2015 for #30 with 2 refills.  No future appointments.  Refill?

## 2015-12-18 NOTE — Telephone Encounter (Signed)
Ok to ref, 30, 3 ref  F/u cpx 

## 2015-12-18 NOTE — Telephone Encounter (Signed)
Please call and schedule CPE with fasting labs prior for Dr. Copland.  

## 2015-12-18 NOTE — Telephone Encounter (Signed)
Ok to ref, 30, 3 ref  F/u cpx

## 2015-12-24 NOTE — Telephone Encounter (Signed)
Labs 5/31 cpx 6/5 Pt aware Please close

## 2016-03-09 ENCOUNTER — Other Ambulatory Visit: Payer: Managed Care, Other (non HMO)

## 2016-03-14 ENCOUNTER — Encounter: Payer: Managed Care, Other (non HMO) | Admitting: Family Medicine

## 2016-03-24 ENCOUNTER — Other Ambulatory Visit: Payer: Managed Care, Other (non HMO)

## 2016-03-29 ENCOUNTER — Encounter: Payer: Managed Care, Other (non HMO) | Admitting: Family Medicine

## 2016-03-29 DIAGNOSIS — Z0289 Encounter for other administrative examinations: Secondary | ICD-10-CM

## 2016-04-27 ENCOUNTER — Other Ambulatory Visit: Payer: Self-pay | Admitting: Family Medicine

## 2016-04-27 NOTE — Telephone Encounter (Signed)
Last office visit 11/05/2014.  No future appointments scheduled.  Cancelled CPE 03/14/2016 and No Showed CPE 03/29/2016.  Refill?

## 2016-08-31 ENCOUNTER — Other Ambulatory Visit: Payer: Self-pay | Admitting: Family Medicine

## 2016-08-31 NOTE — Telephone Encounter (Signed)
Pt left v/m requesting refill lisinopril-HCTZ. I left v/m per instruction on pts v/m that refill had already been done and pt should ck with pharmacy.

## 2016-09-19 ENCOUNTER — Other Ambulatory Visit (INDEPENDENT_AMBULATORY_CARE_PROVIDER_SITE_OTHER): Payer: Managed Care, Other (non HMO)

## 2016-09-19 DIAGNOSIS — R5383 Other fatigue: Secondary | ICD-10-CM

## 2016-09-19 DIAGNOSIS — E559 Vitamin D deficiency, unspecified: Secondary | ICD-10-CM

## 2016-09-19 DIAGNOSIS — Z13220 Encounter for screening for lipoid disorders: Secondary | ICD-10-CM | POA: Diagnosis not present

## 2016-09-19 DIAGNOSIS — Z1159 Encounter for screening for other viral diseases: Secondary | ICD-10-CM

## 2016-09-19 DIAGNOSIS — Z114 Encounter for screening for human immunodeficiency virus [HIV]: Secondary | ICD-10-CM

## 2016-09-19 LAB — CBC WITH DIFFERENTIAL/PLATELET
BASOS ABS: 0.1 10*3/uL (ref 0.0–0.1)
Basophils Relative: 1.3 % (ref 0.0–3.0)
Eosinophils Absolute: 0.3 10*3/uL (ref 0.0–0.7)
Eosinophils Relative: 6 % — ABNORMAL HIGH (ref 0.0–5.0)
HCT: 37.9 % (ref 36.0–46.0)
Hemoglobin: 12.7 g/dL (ref 12.0–15.0)
LYMPHS ABS: 3 10*3/uL (ref 0.7–4.0)
Lymphocytes Relative: 53.3 % — ABNORMAL HIGH (ref 12.0–46.0)
MCHC: 33.5 g/dL (ref 30.0–36.0)
MCV: 89.2 fl (ref 78.0–100.0)
MONO ABS: 0.4 10*3/uL (ref 0.1–1.0)
MONOS PCT: 6.4 % (ref 3.0–12.0)
NEUTROS ABS: 1.9 10*3/uL (ref 1.4–7.7)
NEUTROS PCT: 33 % — AB (ref 43.0–77.0)
PLATELETS: 238 10*3/uL (ref 150.0–400.0)
RBC: 4.25 Mil/uL (ref 3.87–5.11)
RDW: 13.4 % (ref 11.5–15.5)
WBC: 5.7 10*3/uL (ref 4.0–10.5)

## 2016-09-19 LAB — BASIC METABOLIC PANEL
BUN: 11 mg/dL (ref 6–23)
CALCIUM: 9.3 mg/dL (ref 8.4–10.5)
CHLORIDE: 106 meq/L (ref 96–112)
CO2: 26 mEq/L (ref 19–32)
CREATININE: 0.98 mg/dL (ref 0.40–1.20)
GFR: 75.37 mL/min (ref >60.00)
Glucose, Bld: 117 mg/dL — ABNORMAL HIGH (ref 70–99)
Potassium: 3.9 mEq/L (ref 3.5–5.1)
SODIUM: 139 meq/L (ref 135–145)

## 2016-09-19 LAB — LIPID PANEL
CHOL/HDL RATIO: 3
Cholesterol: 144 mg/dL (ref 0–200)
HDL: 47.2 mg/dL (ref >39.00)
LDL CALC: 72 mg/dL (ref 0–99)
NonHDL: 96.52
TRIGLYCERIDES: 121 mg/dL (ref 0.0–149.0)
VLDL: 24.2 mg/dL (ref 0.0–40.0)

## 2016-09-19 LAB — HEPATIC FUNCTION PANEL
ALT: 23 U/L (ref 0–35)
AST: 16 U/L (ref 0–37)
Albumin: 4.3 g/dL (ref 3.5–5.2)
Alkaline Phosphatase: 53 U/L (ref 39–117)
BILIRUBIN DIRECT: 0.1 mg/dL (ref 0.0–0.3)
BILIRUBIN TOTAL: 0.3 mg/dL (ref 0.2–1.2)
Total Protein: 7.6 g/dL (ref 6.0–8.3)

## 2016-09-19 LAB — VITAMIN D 25 HYDROXY (VIT D DEFICIENCY, FRACTURES): VITD: 15.54 ng/mL — AB (ref 30.00–100.00)

## 2016-09-19 LAB — TSH: TSH: 2.81 u[IU]/mL (ref 0.35–4.50)

## 2016-09-19 NOTE — Addendum Note (Signed)
Addended by: Alvina Chou on: 09/19/2016 09:26 AM   Modules accepted: Orders

## 2016-09-20 LAB — HIV ANTIBODY (ROUTINE TESTING W REFLEX): HIV 1&2 Ab, 4th Generation: NONREACTIVE

## 2016-09-20 LAB — HEPATITIS C ANTIBODY: HCV Ab: NEGATIVE

## 2016-09-28 ENCOUNTER — Encounter: Payer: Self-pay | Admitting: *Deleted

## 2016-09-28 ENCOUNTER — Encounter: Payer: Managed Care, Other (non HMO) | Admitting: Family Medicine

## 2016-09-28 DIAGNOSIS — Z0289 Encounter for other administrative examinations: Secondary | ICD-10-CM

## 2016-09-29 ENCOUNTER — Telehealth: Payer: Self-pay | Admitting: Family Medicine

## 2016-09-29 NOTE — Telephone Encounter (Signed)
Patient discretion 

## 2016-09-29 NOTE — Telephone Encounter (Signed)
Patient did not come in for their appointment on 12/20 for cpe  Please let me know if patient needs to be contacted immediately for follow up or no follow up needed.

## 2016-10-04 ENCOUNTER — Other Ambulatory Visit: Payer: Self-pay

## 2016-10-04 MED ORDER — LISINOPRIL-HYDROCHLOROTHIAZIDE 10-12.5 MG PO TABS: 1.0000 | ORAL_TABLET | Freq: Every day | ORAL | 0 refills | Status: DC

## 2016-10-04 NOTE — Telephone Encounter (Signed)
Pt said had norovirus the last appt time and pt cancelled appt. Pt has CPX scheduled 10/19/16 and request refill lisinopril HCTZ until seen refilled x 1. Pt voiced understanding the importance of keeping 10/19/16 appt.

## 2016-10-19 ENCOUNTER — Ambulatory Visit (INDEPENDENT_AMBULATORY_CARE_PROVIDER_SITE_OTHER): Payer: Managed Care, Other (non HMO) | Admitting: Family Medicine

## 2016-10-19 ENCOUNTER — Encounter: Payer: Self-pay | Admitting: Family Medicine

## 2016-10-19 VITALS — BP 122/70 | HR 94 | Temp 98.3°F | Ht 65.5 in | Wt 217.0 lb

## 2016-10-19 DIAGNOSIS — Z0000 Encounter for general adult medical examination without abnormal findings: Secondary | ICD-10-CM

## 2016-10-19 MED ORDER — TIZANIDINE HCL 4 MG PO TABS: 4.0000 mg | ORAL_TABLET | Freq: Every day | ORAL | 3 refills | Status: DC

## 2016-10-19 MED ORDER — BACLOFEN 10 MG PO TABS: ORAL_TABLET | ORAL | 3 refills | Status: DC

## 2016-10-19 MED ORDER — LISINOPRIL-HYDROCHLOROTHIAZIDE 10-12.5 MG PO TABS: 1.0000 | ORAL_TABLET | Freq: Every day | ORAL | 3 refills | Status: DC

## 2016-10-19 NOTE — Progress Notes (Signed)
Dr. Frederico Hamman T. Makiah Clauson, MD, [facility] [place], [address] Phone: [phone] Fax: [phone]  10/19/2016  Patient: Brandi Baldwin, MRN: [id number], DOB: 06-Sep-1961, 56 y.o.  Primary Physician:  Owens Loffler, MD   Chief Complaint  Patient presents with  . Annual Exam   Subjective:   Brandi Baldwin is a 56 y.o. pleasant patient who presents with the following:  Health Maintenance Summary Reviewed and updated, unless pt declines services.  Tobacco History Reviewed. Non-smoker Alcohol: No concerns, no excessive use Exercise Habits: rare activity, rec at least 30 mins 5 times a week STD concerns: none Drug Use: None Birth control method: hyst Menses regular: n/a Lumps or breast concerns: no Breast Cancer Family History: no  Has a tooth abscess  Swelling and tightness in her left knee. When getting up and has some catching.   Health Maintenance  Topic Date Due  . PAP SMEAR  04/09/2017  . MAMMOGRAM  11/29/2017  . COLONOSCOPY  10/10/2022  . TETANUS/TDAP  05/21/2024  . INFLUENZA VACCINE  Completed  . Hepatitis C Screening  Completed  . HIV Screening  Completed    Immunization History  Administered Date(s) Administered  . Influenza-Unspecified 07/22/2016  . Tdap 05/21/2014   Patient Active Problem List   Diagnosis Date Noted  . Overactive bladder 07/09/2014  . Muscle spasms of both lower extremities 07/09/2014  . Relapsing remitting multiple sclerosis ([place]) 07/09/2014  . Obesity (BMI 30-39.9) 05/22/2014  . S/P hysterectomy 09/18/2012  . Hypertension 09/15/2012  . Multiple sclerosis ([place])    Past Medical History:  Diagnosis Date  . Hypertension   . Multiple sclerosis ([place])    Past Surgical History:  Procedure Laterality Date  . ABDOMINAL HYSTERECTOMY  Partial 1991   Social History   Social History  . Marital status: Married    Spouse name: N/A  . Number of children: N/A  . Years  of education: N/A   Occupational History  . Consulting civil engineer, RN.    Social History Main Topics  . Smoking status: Never Smoker  . Smokeless tobacco: Never Used  . Alcohol use 0.0 oz/week     Comment: socially   . Drug use: No  . Sexual activity: Yes    Partners: Male    Birth control/ protection: Surgical     Comment: partial hysterectomy    Other Topics Concern  . Not on file   Social History Narrative   Licensed conveyancer for [employer]      GYN Care at [facility]            Family History  Problem Relation Age of Onset  . Kidney disease Mother   . Hypertension Mother   . Cancer Brother    Allergies  Allergen Reactions  . Amitriptyline     hallucinations    Medication list has been reviewed and updated.   General: Denies fever, chills, sweats. No significant weight loss. Eyes: Denies blurring,significant itching ENT: Denies earache, sore throat, and hoarseness.  Cardiovascular: Denies chest pains, palpitations, dyspnea on exertion,  Respiratory: Denies cough, dyspnea at rest,wheeezing Breast: no concerns about lumps GI: Denies nausea, vomiting, diarrhea, constipation, change in bowel habits, abdominal pain, melena, hematochezia GU: Denies dysuria, hematuria, urinary hesitancy, nocturia, denies STD risk, no concerns about discharge Musculoskeletal: Denies back pain, joint pain Derm: Denies rash, itching Neuro: Denies  paresthesias, frequent falls, frequent headaches Psych: Denies  depression, anxiety Endocrine: Denies cold intolerance, heat intolerance, polydipsia Heme: Denies enlarged lymph nodes Allergy: No hayfever  Objective:   BP 122/70   Pulse 94   Temp 98.3 F (36.8 C) (Oral)   Ht 5' 5.5" (1.664 m)   Wt 217 lb (98.4 kg)   BMI 35.56 kg/m  No exam data present  GEN: well developed, well nourished, no acute distress Eyes: conjunctiva and lids normal, PERRLA, EOMI ENT: TM clear, nares clear, oral exam WNL Neck: supple, no  lymphadenopathy, no thyromegaly, no JVD Pulm: clear to auscultation and percussion, respiratory effort normal CV: regular rate and rhythm, S1-S2, no murmur, rub or gallop, no bruits Chest: no scars, masses, no lumps BREAST: breast exam declined GI: soft, non-tender; no hepatosplenomegaly, masses; active bowel sounds all quadrants GU: GU exam declined Lymph: no cervical, axillary or inguinal adenopathy MSK: gait normal, muscle tone and strength WNL, no joint swelling, effusions, discoloration, crepitus  SKIN: clear, good turgor, color WNL, no rashes, lesions, or ulcerations Neuro: normal mental status, normal strength, sensation, and motion Psych: alert; oriented to person, place and time, normally interactive and not anxious or depressed in appearance.   All labs reviewed with patient. Lipids:    Component Value Date/Time   CHOL 144 09/19/2016 0844   TRIG 121.0 09/19/2016 0844   HDL 47.20 09/19/2016 0844   LDLDIRECT 110.5 09/13/2012 1425   VLDL 24.2 09/19/2016 0844   CHOLHDL 3 09/19/2016 0844   CBC: CBC Latest Ref Rng & Units 09/19/2016 05/14/2014  WBC 4.0 - 10.5 K/uL 5.7 6.8  Hemoglobin 12.0 - 15.0 g/dL 12.7 12.6  Hematocrit 36.0 - 46.0 % 37.9 37.6  Platelets 150.0 - 400.0 K/uL 238.0 161.0    Basic Metabolic Panel:    Component Value Date/Time   NA 139 09/19/2016 0844   K 3.9 09/19/2016 0844   CL 106 09/19/2016 0844   CO2 26 09/19/2016 0844   BUN 11 09/19/2016 0844   CREATININE 0.98 09/19/2016 0844   GLUCOSE 117 (H) 09/19/2016 0844   CALCIUM 9.3 09/19/2016 0844   Hepatic Function Latest Ref Rng & Units 09/19/2016 05/14/2014 09/13/2012  Total Protein 6.0 - 8.3 g/dL 7.6 7.7 7.7  Albumin 3.5 - 5.2 g/dL 4.3 4.2 4.0  AST 0 - 37 U/L '16 19 25  '$ ALT 0 - 35 U/L '23 18 25  '$ Alk Phosphatase 39 - 117 U/L 53 63 70  Total Bilirubin 0.2 - 1.2 mg/dL 0.3 0.4 0.2(L)  Bilirubin, Direct 0.0 - 0.3 mg/dL 0.1 0.0 0.0    Lab Results  Component Value Date   TSH 2.81 09/19/2016   No results  found.  Assessment and Plan:   Healthcare maintenance  Health Maintenance Exam: The patient's preventative maintenance and recommended screening tests for an annual wellness exam were reviewed in full today. Brought up to date unless services declined.  Counselled on the importance of diet, exercise, and its role in overall health and mortality. The patient's FH and SH was reviewed, including their home life, tobacco status, and drug and alcohol status.  Follow-up in 1 year for physical exam or additional follow-up below.  Follow-up: No Follow-up on file. Or follow-up in 1 year if not noted.  Meds ordered this encounter  Medications  . amoxicillin (AMOXIL) 500 MG tablet    Sig: Take 500 mg by mouth 2 (two) times daily.  Marland Kitchen tiZANidine (ZANAFLEX) 4 MG tablet    Sig: Take 1 tablet (4 mg total) by mouth at bedtime.    Dispense:  90 tablet    Refill:  3  . baclofen (LIORESAL) 10 MG tablet    Sig: TAKE 1 TABLET (10 MG TOTAL) BY MOUTH 2 (TWO) TIMES DAILY.    Dispense:  180 tablet    Refill:  3  . lisinopril-hydrochlorothiazide (PRINZIDE,ZESTORETIC) 10-12.5 MG tablet    Sig: Take 1 tablet by mouth daily.    Dispense:  90 tablet    Refill:  3   Medications Discontinued During This Encounter  Medication Reason  . baclofen (LIORESAL) 10 MG tablet Change in therapy  . estradiol (ESTRACE VAGINAL) 0.1 MG/GM vaginal cream Change in therapy  . interferon beta-1a (REBIF REBIDOSE) 44 MCG/0.5ML injection Completed Course  . tiZANidine (ZANAFLEX) 4 MG tablet Reorder  . baclofen (LIORESAL) 10 MG tablet Reorder  . lisinopril-hydrochlorothiazide (PRINZIDE,ZESTORETIC) 10-12.5 MG tablet Reorder   No orders of the defined types were placed in this encounter.   Signed,  Maud Deed. Rashanda Magloire, MD   Allergies as of 10/19/2016      Reactions   Amitriptyline    hallucinations      Medication List       Accurate as of 10/19/16  2:16 PM. Always use your most recent med list.            amoxicillin 500 MG tablet Commonly known as:  AMOXIL Take 500 mg by mouth 2 (two) times daily.   aspirin 81 MG tablet Take 81 mg by mouth daily.   baclofen 10 MG tablet Commonly known as:  LIORESAL TAKE 1 TABLET (10 MG TOTAL) BY MOUTH 2 (TWO) TIMES DAILY.   estradiol 1 MG tablet Commonly known as:  ESTRACE Take 1 mg by mouth daily.   lisinopril-hydrochlorothiazide 10-12.5 MG tablet Commonly known as:  PRINZIDE,ZESTORETIC Take 1 tablet by mouth daily.   tiZANidine 4 MG tablet Commonly known as:  ZANAFLEX Take 1 tablet (4 mg total) by mouth at bedtime.   valACYclovir 1000 MG tablet Commonly known as:  VALTREX Take 1 tablet by mouth daily.

## 2016-10-19 NOTE — Progress Notes (Signed)
Pre visit review using our clinic review tool, if applicable. No additional management support is needed unless otherwise documented below in the visit note. 

## 2017-06-07 ENCOUNTER — Ambulatory Visit: Payer: Managed Care, Other (non HMO) | Admitting: Family Medicine

## 2017-08-08 ENCOUNTER — Other Ambulatory Visit: Payer: Self-pay | Admitting: Gastroenterology

## 2017-08-08 DIAGNOSIS — R112 Nausea with vomiting, unspecified: Secondary | ICD-10-CM

## 2017-08-08 NOTE — Progress Notes (Signed)
Brandi Magnussen MD 

## 2017-08-14 ENCOUNTER — Ambulatory Visit (HOSPITAL_COMMUNITY)
Admission: RE | Admit: 2017-08-14 | Discharge: 2017-08-14 | Disposition: A | Discharge status: Home / Self Care | Payer: PRIVATE HEALTH INSURANCE | Source: Ambulatory Visit | Attending: Gastroenterology | Admitting: Gastroenterology

## 2017-08-14 DIAGNOSIS — N281 Cyst of kidney, acquired: Secondary | ICD-10-CM | POA: Diagnosis not present

## 2017-08-14 DIAGNOSIS — R110 Nausea: Secondary | ICD-10-CM | POA: Diagnosis not present

## 2017-08-14 DIAGNOSIS — R1013 Epigastric pain: Secondary | ICD-10-CM | POA: Insufficient documentation

## 2017-08-14 DIAGNOSIS — R112 Nausea with vomiting, unspecified: Secondary | ICD-10-CM

## 2017-08-22 ENCOUNTER — Ambulatory Visit (HOSPITAL_COMMUNITY)
Admission: RE | Admit: 2017-08-22 | Discharge: 2017-08-22 | Disposition: A | Discharge status: Home / Self Care | Payer: PRIVATE HEALTH INSURANCE | Source: Ambulatory Visit | Attending: Gastroenterology | Admitting: Gastroenterology

## 2017-08-22 ENCOUNTER — Encounter (HOSPITAL_COMMUNITY): Payer: Self-pay

## 2017-08-22 DIAGNOSIS — R112 Nausea with vomiting, unspecified: Secondary | ICD-10-CM

## 2017-08-30 ENCOUNTER — Ambulatory Visit (HOSPITAL_COMMUNITY)
Admission: RE | Admit: 2017-08-30 | Discharge: 2017-08-30 | Disposition: A | Discharge status: Home / Self Care | Payer: PRIVATE HEALTH INSURANCE | Source: Ambulatory Visit | Attending: Gastroenterology | Admitting: Gastroenterology

## 2017-08-30 DIAGNOSIS — R110 Nausea: Secondary | ICD-10-CM | POA: Diagnosis not present

## 2017-08-30 DIAGNOSIS — R1013 Epigastric pain: Secondary | ICD-10-CM | POA: Diagnosis present

## 2017-08-30 MED ORDER — TECHNETIUM TC 99M MEBROFENIN IV KIT
5.0000 | PACK | Freq: Once | INTRAVENOUS | Status: AC | PRN
  Administered 2017-08-30: 5 via INTRAVENOUS

## 2017-10-05 ENCOUNTER — Encounter: Payer: Self-pay | Admitting: Emergency Medicine

## 2017-10-05 ENCOUNTER — Other Ambulatory Visit: Payer: Self-pay

## 2017-10-05 ENCOUNTER — Emergency Department
Admission: EM | Admit: 2017-10-05 | Discharge: 2017-10-05 | Disposition: A | Discharge status: Home / Self Care | Payer: PRIVATE HEALTH INSURANCE | Attending: Emergency Medicine | Admitting: Emergency Medicine

## 2017-10-05 DIAGNOSIS — Z79899 Other long term (current) drug therapy: Secondary | ICD-10-CM | POA: Insufficient documentation

## 2017-10-05 DIAGNOSIS — I10 Essential (primary) hypertension: Secondary | ICD-10-CM | POA: Insufficient documentation

## 2017-10-05 DIAGNOSIS — Z7982 Long term (current) use of aspirin: Secondary | ICD-10-CM | POA: Diagnosis not present

## 2017-10-05 DIAGNOSIS — R1013 Epigastric pain: Secondary | ICD-10-CM | POA: Diagnosis present

## 2017-10-05 DIAGNOSIS — K2900 Acute gastritis without bleeding: Secondary | ICD-10-CM | POA: Insufficient documentation

## 2017-10-05 LAB — COMPREHENSIVE METABOLIC PANEL
ALT: 14 U/L (ref 14–54)
AST: 17 U/L (ref 15–41)
Albumin: 4.4 g/dL (ref 3.5–5.0)
Alkaline Phosphatase: 59 U/L (ref 38–126)
Anion gap: 5 (ref 5–15)
BILIRUBIN TOTAL: 0.7 mg/dL (ref 0.3–1.2)
BUN: 12 mg/dL (ref 6–20)
CALCIUM: 8.9 mg/dL (ref 8.9–10.3)
CO2: 27 mmol/L (ref 22–32)
CREATININE: 0.92 mg/dL (ref 0.44–1.00)
Chloride: 105 mmol/L (ref 101–111)
Glucose, Bld: 86 mg/dL (ref 65–99)
Potassium: 3.7 mmol/L (ref 3.5–5.1)
Sodium: 137 mmol/L (ref 135–145)
TOTAL PROTEIN: 7.7 g/dL (ref 6.5–8.1)

## 2017-10-05 LAB — URINALYSIS, COMPLETE (UACMP) WITH MICROSCOPIC
Bacteria, UA: NONE SEEN
Bilirubin Urine: NEGATIVE
GLUCOSE, UA: NEGATIVE mg/dL
Hgb urine dipstick: NEGATIVE
KETONES UR: NEGATIVE mg/dL
NITRITE: NEGATIVE
PH: 7 (ref 5.0–8.0)
Protein, ur: NEGATIVE mg/dL
SPECIFIC GRAVITY, URINE: 1.023 (ref 1.005–1.030)

## 2017-10-05 LAB — CBC
HCT: 36.8 % (ref 35.0–47.0)
Hemoglobin: 12.3 g/dL (ref 12.0–16.0)
MCH: 30.6 pg (ref 26.0–34.0)
MCHC: 33.3 g/dL (ref 32.0–36.0)
MCV: 91.8 fL (ref 80.0–100.0)
PLATELETS: 250 10*3/uL (ref 150–440)
RBC: 4.01 MIL/uL (ref 3.80–5.20)
RDW: 13.1 % (ref 11.5–14.5)
WBC: 6.7 10*3/uL (ref 3.6–11.0)

## 2017-10-05 LAB — LIPASE, BLOOD: Lipase: 29 U/L (ref 11–51)

## 2017-10-05 MED ORDER — SUCRALFATE 1 G PO TABS: 1.0000 g | ORAL_TABLET | Freq: Three times a day (TID) | ORAL | 0 refills | Status: AC

## 2017-10-05 NOTE — ED Notes (Signed)
Pt to ed with c/o abd pain since Christmas,  Pt reports nausea, denies vomiting.  Also denies diarrhea.  Pt states last BM that was normal was on Monday.  Pt reports pain in abd 8/10 at this time, states pain worse with movement.  Pt states pain is epigastric and radiates to back.

## 2017-10-05 NOTE — ED Triage Notes (Signed)
Pt reports she is having abd pain, She has had nausea. Her abd appears to be distended (she reports that her stomach does not look like that) It is tender on palpation. She report that she belching a lot without any relief.

## 2017-10-05 NOTE — ED Provider Notes (Signed)
[facility] Emergency Department Provider Note   ____________________________________________    I have reviewed the triage vital signs and the nursing notes.   HISTORY  Chief Complaint Abdominal Pain     HPI Brandi Baldwin is a 56 y.o. female who presents with complaints of abdominal pain.  Patient complains of epigastric pain which is moderate to severe and radiates slightly to her right upper abdomen.  She had a similar episode several weeks ago and saw her gastroenterologist who obtained ultrasound of her abdomen as well as a nuclear medicine study which showed a normal gallbladder.  Patient reports she believes that episode was caused by eating fatty foods.  She had been feeling better but over the holidays ate significant fried foods which she thinks caused her pain today, she has not taken anything for   Past Medical History:  Diagnosis Date  . Hypertension   . Multiple sclerosis [facility](H[facility])     Patient Active Problem List   Diagnosis Date Noted  . Overactive bladder 07/09/2014  . Muscle spasms of both lower extremities 07/09/2014  . Relapsing remitting multiple sclerosis ([facility]) 07/09/2014  . Obesity (BMI 30-39.9) 05/22/2014  . S/P hysterectomy 09/18/2012  . Hypertension 09/15/2012  . Multiple sclerosis ([facility])     Past Surgical History:  Procedure Laterality Date  . ABDOMINAL HYSTERECTOMY  Partial 1991    Prior to Admission medications   Medication Sig Start Date End Date Taking? Authorizing Provider  amoxicillin (AMOXIL) 500 MG tablet Take 500 mg by mouth 2 (two) times daily.    [provider]  aspirin 81 MG tablet Take 81 mg by mouth daily.    [provider]  baclofen (LIORESAL) 10 MG tablet TAKE 1 TABLET (10 MG TOTAL) BY MOUTH 2 (TWO) TIMES DAILY. 10/19/16   Copland, Karleen HampshireSpencer, MD  estradiol (ESTRACE) 1 MG tablet Take 1 mg by mouth daily. 10/20/14   [provider]  lisinopril-hydrochlorothiazide  (PRINZIDE,ZESTORETIC) 10-12.5 MG tablet Take 1 tablet by mouth daily. 10/19/16   Copland, Karleen HampshireSpencer, MD  sucralfate (CARAFATE) 1 g tablet Take 1 tablet (1 g total) by mouth 4 (four) times daily -  with meals and at bedtime for 15 days. 10/05/17 10/20/17  Jene EveryKinner, Krystelle Prashad, MD  tiZANidine (ZANAFLEX) 4 MG tablet Take 1 tablet (4 mg total) by mouth at bedtime. 10/19/16   Copland, Karleen HampshireSpencer, MD  valACYclovir (VALTREX) 1000 MG tablet Take 1 tablet by mouth daily. 04/06/14   [provider]     Allergies Amitriptyline  Family History  Problem Relation Age of Onset  . Kidney disease Mother   . Hypertension Mother   . Cancer Brother     Social History Social History   Tobacco Use  . Smoking status: Never Smoker  . Smokeless tobacco: Never Used  Substance Use Topics  . Alcohol use: Yes    Alcohol/week: 0.0 oz    Comment: socially   . Drug use: No    Review of Systems  Constitutional: No fever/chills Eyes: No visual changes.  ENT: No sore throat. Cardiovascular: Denies chest pain. Respiratory: Denies shortness of breath. Gastrointestinal: As above Genitourinary: Negative for dysuria. Musculoskeletal: Negative for back pain. Skin: Negative for rash. Neurological: Negative for headaches    ____________________________________________   PHYSICAL EXAM:  VITAL SIGNS: ED Triage Vitals  Enc Vitals Group     BP 10/05/17 1501 (!) 147/76     Pulse Rate 10/05/17 1501 74     Resp 10/05/17 1501 16  Temp 10/05/17 1501 98.2 F (36.8 C)     Temp Source 10/05/17 1501 Oral     SpO2 10/05/17 1501 98 %     Weight 10/05/17 1503 92.5 kg (204 lb)     Height 10/05/17 1503 1.702 m (5\' 7" )     Head Circumference --      Peak Flow --      Pain Score 10/05/17 1501 9     Pain Loc --      Pain Edu? --      Excl. in GC? --     Constitutional: Alert and oriented. No acute distress. Pleasant and interactive   Nose: No congestion/rhinnorhea. Mouth/Throat: Mucous membranes are moist.     Cardiovascular: Normal rate, regular rhythm. Grossly normal heart sounds.  Good peripheral circulation. Respiratory: Normal respiratory effort.  No retractions. Lungs CTAB. Gastrointestinal: Mild epigastric tenderness to palpation. No distention.  No CVA tenderness. Genitourinary: deferred Musculoskeletal:   Warm and well perfused Neurologic:  Normal speech and language. No gross focal neurologic deficits are appreciated.  Skin:  Skin is warm, dry and intact. No rash noted. Psychiatric: Mood and affect are normal. Speech and behavior are normal.  ____________________________________________   LABS (all labs ordered are listed, but only abnormal results are displayed)  Labs Reviewed  URINALYSIS, COMPLETE (UACMP) WITH MICROSCOPIC - Abnormal; Notable for the following components:      Result Value   Color, Urine YELLOW (*)    APPearance CLEAR (*)    Leukocytes, UA TRACE (*)    Squamous Epithelial / LPF 0-5 (*)    All other components within normal limits  LIPASE, BLOOD  COMPREHENSIVE METABOLIC PANEL  CBC   ____________________________________________  EKG  ED ECG REPORT I, Jene Every, the attending physician, personally viewed and interpreted this ECG.  Date: 10/05/2017  Rhythm: normal sinus rhythm QRS Axis: normal Intervals: normal ST/T Wave abnormalities: normal Narrative Interpretation: no evidence of acute ischemia  ____________________________________________  RADIOLOGY  Reviewed images from earlier in the month ____________________________________________   PROCEDURES  Procedure(s) performed: No  Procedures   Critical Care performed:No ____________________________________________   INITIAL IMPRESSION / ASSESSMENT AND PLAN / ED COURSE  Pertinent labs & imaging results that were available during my care of the patient were reviewed by me and considered in my medical decision making (see chart for details).  Patient well-appearing in no acute  distress.  Epigastric discomfort differential diagnosis includes cholelithiasis however normal workup earlier this month, pancreatitis, gastritis  Strongly suspect gastritis given relationship to eating.  Offered CT scan to the patient however she would like to try Carafate and dietary modifications to see if her pain improves.  She is already on PPI.  She will follow-up with her GI physician for consideration of EGD if no improvement    ____________________________________________   FINAL CLINICAL IMPRESSION(S) / ED DIAGNOSES  Final diagnoses:  Acute gastritis without hemorrhage, unspecified gastritis type        Note:  This document was prepared using Dragon voice recognition software and may include unintentional dictation errors.    Jene Every, MD 10/05/17 2134

## 2017-10-19 ENCOUNTER — Other Ambulatory Visit: Payer: Self-pay | Admitting: Family Medicine

## 2017-10-19 NOTE — Telephone Encounter (Signed)
Last office visit 10/19/2016. Both meds last refilled 10/19/2016 for 1 year. No future appointments.  Refill?

## 2017-12-03 IMAGING — NM NM HEPATO W/GB/PHARM/[PERSON_NAME]
3 series · 13 of 13 positions shown · non-contrast
Comparison: None

CLINICAL DATA: Nausea and vomiting

EXAM:
NUCLEAR MEDICINE HEPATOBILIARY IMAGING WITH GALLBLADDER EF
TECHNIQUE: Sequential images of the abdomen were obtained [DATE] minutes
following intravenous administration of radiopharmaceutical. After
oral ingestion of Ensure, gallbladder ejection fraction was
determined. At 60 min, normal ejection fraction is greater than 33%.
RADIOPHARMACEUTICALS:  5.1 mCi Cc-WWm  Choletec IV

[he hepatobiliary · 3.10mm/px · 6 of 60 frames shown (1 of 3)]
[frame 6/60]
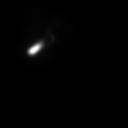
[frame 16/60]
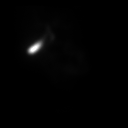
[frame 26/60]
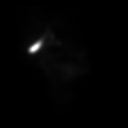
[frame 36/60]
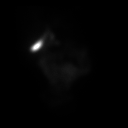
[frame 46/60]
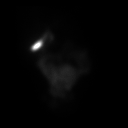
[frame 56/60]
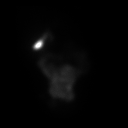

[he hepatobiliary · 3.10mm/px · 6 of 60 frames shown (2 of 3)]
[frame 6/60]
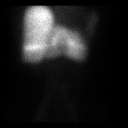
[frame 16/60]
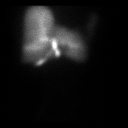
[frame 26/60]
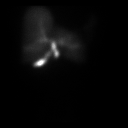
[frame 36/60]
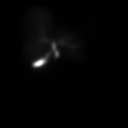
[frame 46/60]
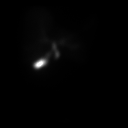
[frame 56/60]
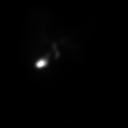

[he hepatobiliary · 2.26mm/px · 1 of 1 slices shown (3 of 3)]
[im 1/1]
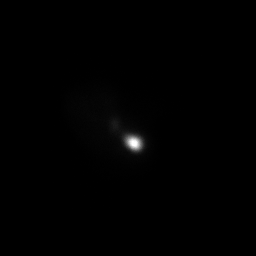

[13 of 13 positions shown; findings below may reference images not displayed]

FINDINGS: Normal tracer extraction from bloodstream indicating normal
hepatocellular function.

Normal excretion of tracer into biliary tree.

Gallbladder visualized at 16 min.

Small bowel not visualized until following fatty meal stimulation

No hepatic retention of tracer.

Subjectively normal emptying of tracer from gallbladder following
fatty meal stimulation.

Calculated gallbladder ejection fraction is 75%, normal.

Patient reported no symptoms following Ensure ingestion.

Normal gallbladder ejection fraction following Ensure ingestion is
greater than 33% at 1 hour.
IMPRESSION: Normal exam.

## 2018-01-18 ENCOUNTER — Other Ambulatory Visit: Payer: Self-pay | Admitting: Family Medicine

## 2018-04-03 ENCOUNTER — Telehealth: Payer: Self-pay | Admitting: Family Medicine

## 2018-04-03 NOTE — Telephone Encounter (Signed)
Copied from CRM [id number]. Topic: Quick Communication - Rx Refill/Question >> Apr 03, 2018  1:02 PM Mickel Baas B, NT wrote: Medication: lisinopril-hydrochlorothiazide (PRINZIDE,ZESTORETIC) 10-12.5 MG tablet  Has the patient contacted their pharmacy? Yes.   (Agent: If no, request that the patient contact the pharmacy for the refill.) (Agent: If yes, when and what did the pharmacy advise?)  Preferred Pharmacy (with phone number or street name): [id number] - [place], [place] - [address]  Agent: Please be advised that RX refills may take up to 3 business days. We ask that you follow-up with your pharmacy.   **Patient has CPE schedule for 04/30/18. Would like enough medication sent to the pharmacy to last until her appointment**

## 2018-04-04 MED ORDER — LISINOPRIL-HYDROCHLOROTHIAZIDE 10-12.5 MG PO TABS: 1.0000 | ORAL_TABLET | Freq: Every day | ORAL | 0 refills | Status: DC

## 2018-04-04 NOTE — Telephone Encounter (Signed)
Ok to refill 30, 0 ref 

## 2018-04-04 NOTE — Telephone Encounter (Signed)
Refill sent as instructed by Dr. Copland. 

## 2018-04-04 NOTE — Telephone Encounter (Signed)
Pt has upcoming appt 7/22 but will be outside of the 71mo window for medication refills, as her last OV was 10/19/16. She was only given #15 with last refill 01/2018. pls advise

## 2018-04-28 ENCOUNTER — Other Ambulatory Visit: Payer: Self-pay | Admitting: Family Medicine

## 2018-04-30 ENCOUNTER — Encounter: Payer: PRIVATE HEALTH INSURANCE | Admitting: Family Medicine

## 2018-04-30 DIAGNOSIS — Z0289 Encounter for other administrative examinations: Secondary | ICD-10-CM

## 2018-05-04 LAB — HM COLONOSCOPY

## 2018-05-09 ENCOUNTER — Encounter: Payer: Self-pay | Admitting: Family Medicine

## 2018-05-15 ENCOUNTER — Other Ambulatory Visit: Payer: Self-pay | Admitting: Family Medicine

## 2018-05-21 ENCOUNTER — Encounter: Payer: PRIVATE HEALTH INSURANCE | Admitting: Family Medicine

## 2018-05-21 DIAGNOSIS — Z0289 Encounter for other administrative examinations: Secondary | ICD-10-CM

## 2018-05-21 NOTE — Progress Notes (Deleted)
Dr. Karleen Hampshire T. Rachit Grim, MD, CAQ [facility] [address] [place], [address] Phone: [phone] Fax: [phone]  05/21/2018  Patient: Brandi Baldwin, MRN: [id number], DOB: Jan 01, 1961, 57 y.o.  Primary Physician:  Hannah Beat, MD   No chief complaint on file.  Subjective:   Brandi Baldwin is a 57 y.o. pleasant patient who presents with the following:  Health Maintenance Summary Reviewed and updated, unless pt declines services.  Tobacco History Reviewed. Non-smoker Alcohol: No concerns, no excessive use Exercise Habits: Some activity, rec at least 30 mins 5 times a week STD concerns: none Drug Use: None Birth control method:hyst Menses regular: hyst Lumps or breast concerns: no Breast Cancer Family History: no  Health Maintenance  Topic Date Due  . PAP SMEAR  04/09/2017  . MAMMOGRAM  11/29/2017  . INFLUENZA VACCINE  05/10/2018  . COLONOSCOPY  05/05/2023  . TETANUS/TDAP  05/21/2024  . Hepatitis C Screening  Completed  . HIV Screening  Completed    Immunization History  Administered Date(s) Administered  . Influenza-Unspecified 07/22/2016  . Tdap 05/21/2014   Patient Active Problem List   Diagnosis Date Noted  . Overactive bladder 07/09/2014  . Muscle spasms of both lower extremities 07/09/2014  . Relapsing remitting multiple sclerosis (HCC) 07/09/2014  . Obesity (BMI 30-39.9) 05/22/2014  . S/P hysterectomy 09/18/2012  . Hypertension 09/15/2012  . Multiple sclerosis (HCC)    Past Medical History:  Diagnosis Date  . Hypertension   . Multiple sclerosis (HCC)    Past Surgical History:  Procedure Laterality Date  . ABDOMINAL HYSTERECTOMY  Partial 1991   Social History   Socioeconomic History  . Marital status: Married    Spouse name: Not on file  . Number of children: Not on file  . Years of education: Not on file  . Highest education level: Not on file  Occupational History  . 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   Social Needs  . Financial resource strain: Not on file  . Food insecurity:    Worry: Not on file    Inability: Not on file  . Transportation needs:    Medical: Not on file    Non-medical: Not on file  Tobacco Use  . Smoking status: Never Smoker  . Smokeless tobacco: Never Used  Substance and Sexual Activity  . Alcohol use: Yes    Alcohol/week: 0.0 standard drinks    Comment: socially   . Drug use: No  . Sexual activity: Yes    Partners: Male    Birth control/protection: Surgical    Comment: partial hysterectomy   Lifestyle  . Physical activity:    Days per week: Not on file    Minutes per session: Not on file  . Stress: Not on file  Relationships  . Social connections:    Talks on phone: Not on file    Gets together: Not on file    Attends religious service: Not on file    Active member of club or organization: Not on file    Attends meetings of clubs or organizations: Not on file    Relationship status: Not on file  . Intimate partner violence:    Fear of current or ex partner: Not on file    Emotionally abused: Not on file    Physically abused: Not on file    Forced sexual activity: Not on file  Other Topics Concern  . Not on  file  Social History Narrative   Consulting civil engineer for [employer]      [facility] Care at [facility]         Family History  Problem Relation Age of Onset  . Kidney disease Mother   . Hypertension Mother   . Cancer Brother    Allergies  Allergen Reactions  . Amitriptyline     hallucinations    Medication list has been reviewed and updated.   General: Denies fever, chills, sweats. No significant weight loss. Eyes: Denies blurring,significant itching ENT: Denies earache, sore throat, and hoarseness.  Cardiovascular: Denies chest pains, palpitations, dyspnea on exertion,  Respiratory: Denies cough, dyspnea at rest,wheeezing Breast: no concerns about lumps GI: Denies nausea, vomiting,  diarrhea, constipation, change in bowel habits, abdominal pain, melena, hematochezia GU: Denies dysuria, hematuria, urinary hesitancy, nocturia, denies STD risk, no concerns about discharge Musculoskeletal: Denies back pain, joint pain Derm: Denies rash, itching Neuro: Denies  paresthesias, frequent falls, frequent headaches Psych: Denies depression, anxiety Endocrine: Denies cold intolerance, heat intolerance, polydipsia Heme: Denies enlarged lymph nodes Allergy: No hayfever  Objective:   There were no vitals taken for this visit. No exam data present  GEN: well developed, well nourished, no acute distress Eyes: conjunctiva and lids normal, PERRLA, EOMI ENT: TM clear, nares clear, oral exam WNL Neck: supple, no lymphadenopathy, no thyromegaly, no JVD Pulm: clear to auscultation and percussion, respiratory effort normal CV: regular rate and rhythm, S1-S2, no murmur, rub or gallop, no bruits Chest: no scars, masses, no lumps BREAST: breast exam declined GI: soft, non-tender; no hepatosplenomegaly, masses; active bowel sounds all quadrants GU: GU exam declined Lymph: no cervical, axillary or inguinal adenopathy MSK: gait normal, muscle tone and strength WNL, no joint swelling, effusions, discoloration, crepitus  SKIN: clear, good turgor, color WNL, no rashes, lesions, or ulcerations Neuro: normal mental status, normal strength, sensation, and motion Psych: alert; oriented to person, place and time, normally interactive and not anxious or depressed in appearance.  Assessment and Plan:   Healthcare maintenance  Health Maintenance Exam: The patient's preventative maintenance and recommended screening tests for an annual wellness exam were reviewed in full today. Brought up to date unless services declined.  Counselled on the importance of diet, exercise, and its role in overall health and mortality. The patient's FH and SH was reviewed, including their home life, tobacco status, and  drug and alcohol status.  Follow-up in 1 year for physical exam or additional follow-up below.  Follow-up: No follow-ups on file. Or follow-up in 1 year if not noted.  Signed,  Elpidio Galea. Anan Dapolito, MD   Allergies as of 05/21/2018      Reactions   Amitriptyline    hallucinations      Medication List        Accurate as of 05/21/18  1:57 PM. Always use your most recent med list.          amoxicillin 500 MG tablet Commonly known as:  AMOXIL Take 500 mg by mouth 2 (two) times daily.   aspirin 81 MG tablet Take 81 mg by mouth daily.   baclofen 10 MG tablet Commonly known as:  LIORESAL TAKE 1 TABLET (10 MG TOTAL) BY MOUTH 2 (TWO) TIMES DAILY.   estradiol 1 MG tablet Commonly known as:  ESTRACE Take 1 mg by mouth daily.   lisinopril-hydrochlorothiazide 10-12.5 MG tablet Commonly known as:  PRINZIDE,ZESTORETIC TAKE 1 TABLET BY MOUTH EVERY DAY   sucralfate 1 g tablet Commonly known as:  CARAFATE Take 1 tablet (1 g total) by mouth 4 (four) times daily -  with meals and at bedtime for 15 days.   tiZANidine 4 MG tablet Commonly known as:  ZANAFLEX TAKE 1 TABLET (4 MG TOTAL) BY MOUTH AT BEDTIME.   valACYclovir 1000 MG tablet Commonly known as:  VALTREX Take 1 tablet by mouth daily.

## 2018-05-25 ENCOUNTER — Other Ambulatory Visit: Payer: Self-pay | Admitting: *Deleted

## 2018-05-25 MED ORDER — LISINOPRIL-HYDROCHLOROTHIAZIDE 10-12.5 MG PO TABS: 1.0000 | ORAL_TABLET | Freq: Every day | ORAL | 0 refills | Status: AC

## 2018-05-25 NOTE — Telephone Encounter (Signed)
Last office visit 10/19/2016.  Patient cancelled her CPE  04/30/2018 and no showed on 05/21/2018.   No future appointments.  Refill?

## 2018-10-27 ENCOUNTER — Other Ambulatory Visit: Payer: Self-pay | Admitting: Family Medicine

## 2019-03-12 IMAGING — US US ABDOMEN COMPLETE
1 series · 13 of 25 positions shown · non-contrast
Comparison: 03/28/2007 CT.

CLINICAL DATA: 56-year-old female with epigastric pain and nausea.
Initial encounter.

EXAM:
ABDOMEN ULTRASOUND COMPLETE

[Series 1: us abdomen complete · 0.17mm/px · 13 of 89 slices shown]
[im 1/89]
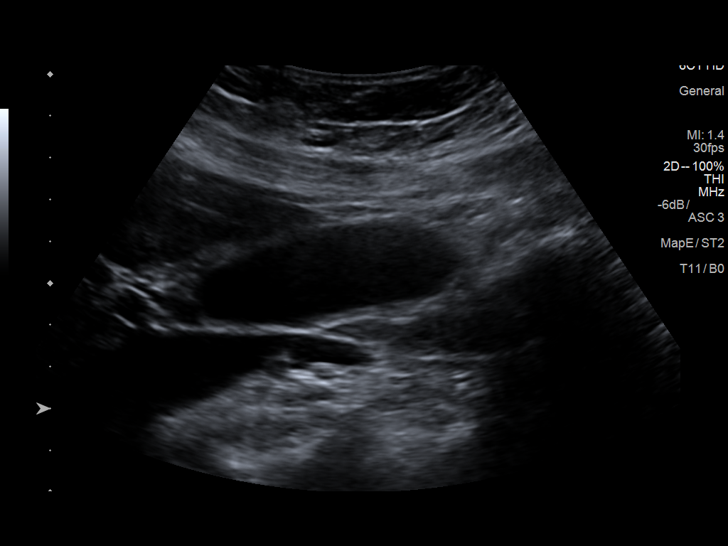
[im 8/89]
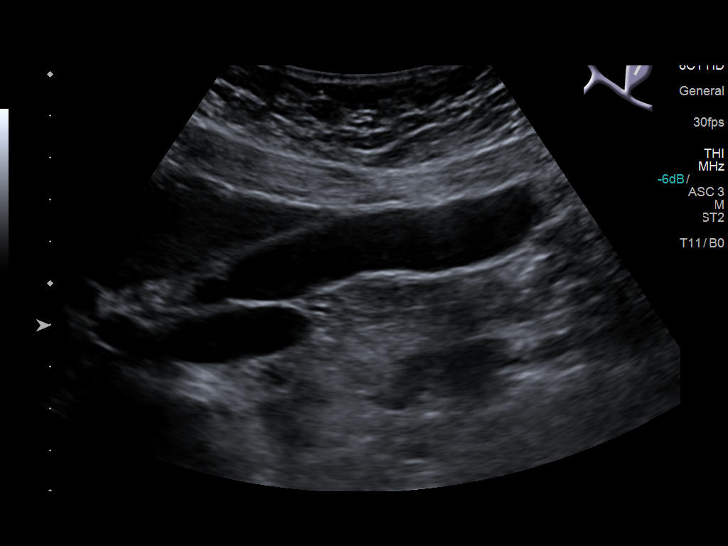
[im 15/89]
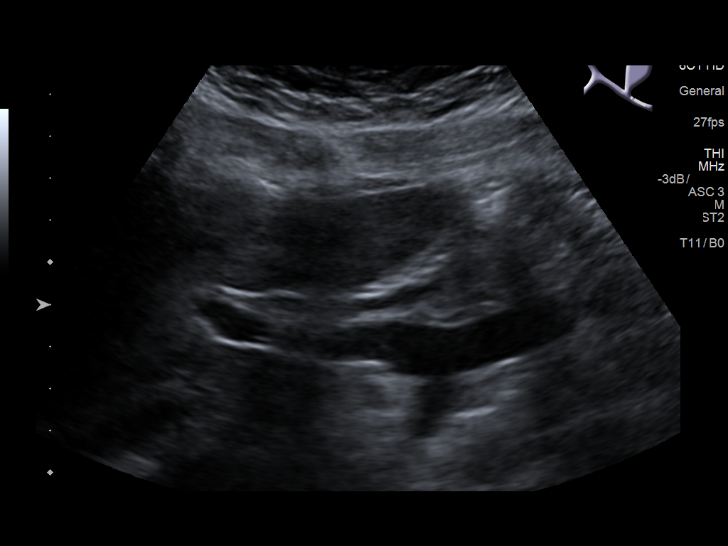
[im 23/89]
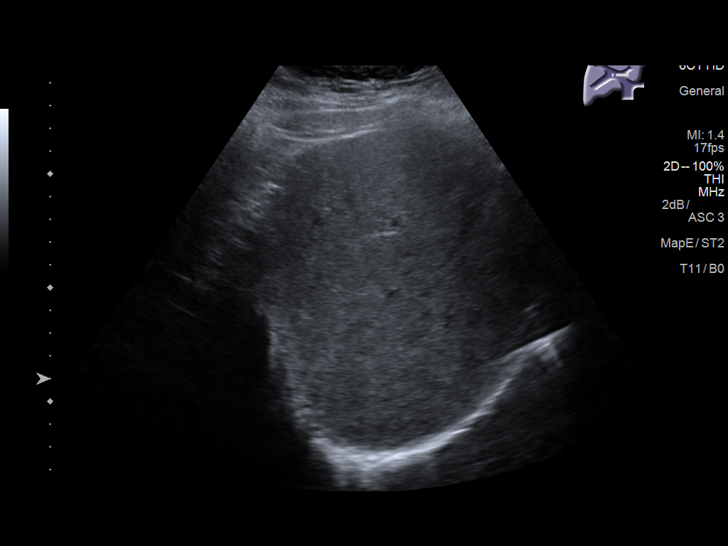
[im 30/89]
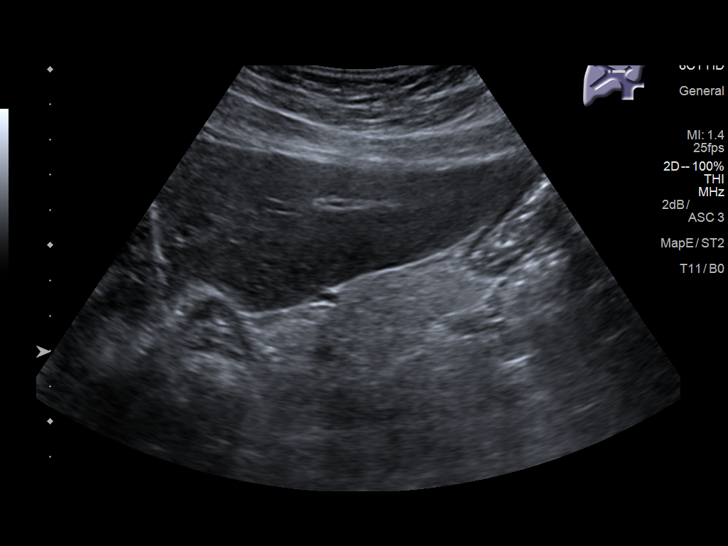
[im 37/89]
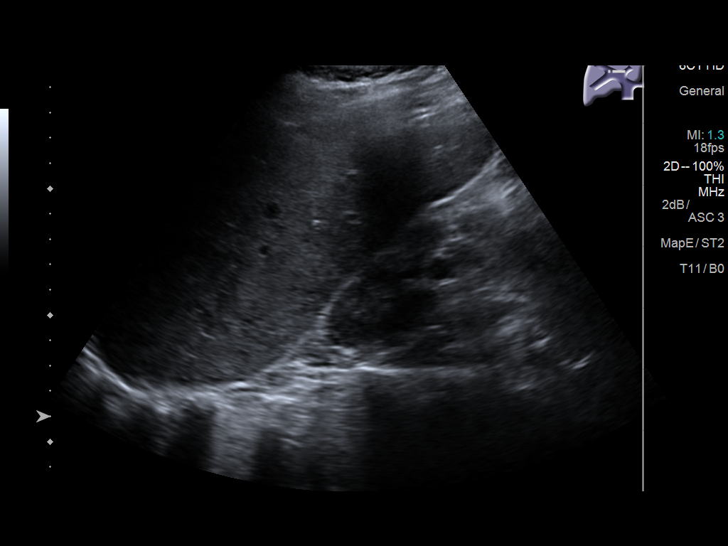
[im 45/89]
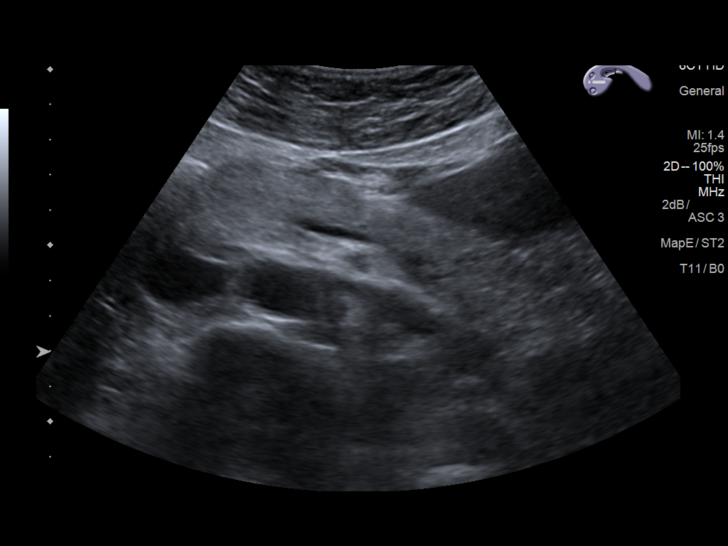
[im 52/89]
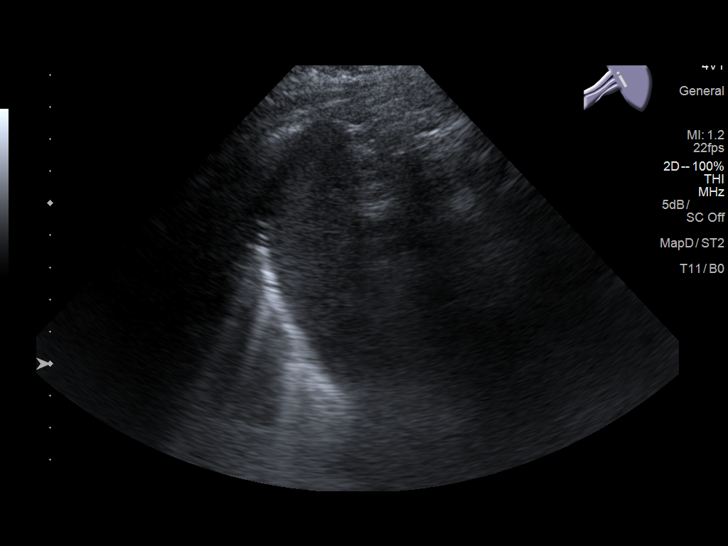
[im 59/89]
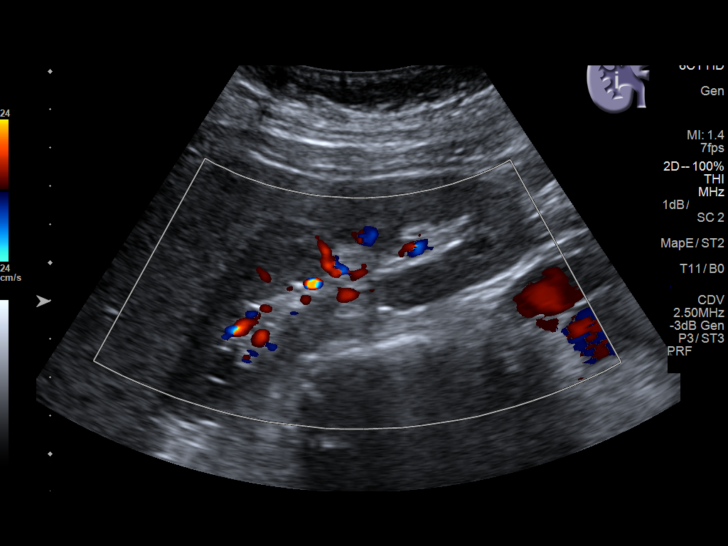
[im 67/89]
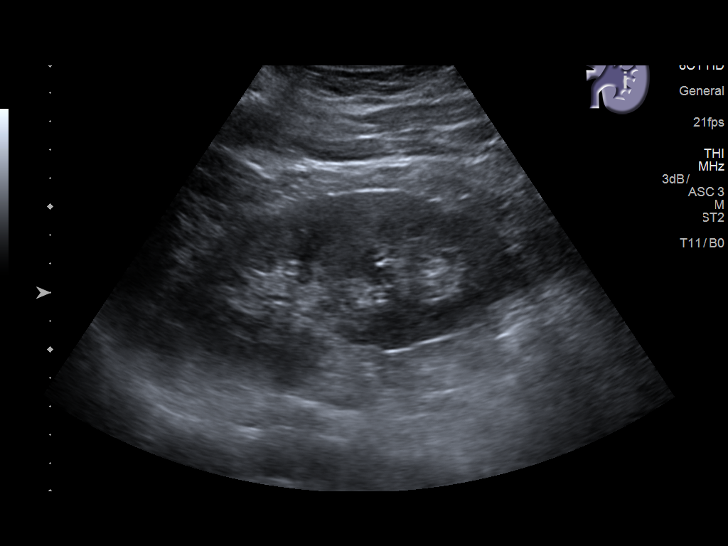
[im 74/89]
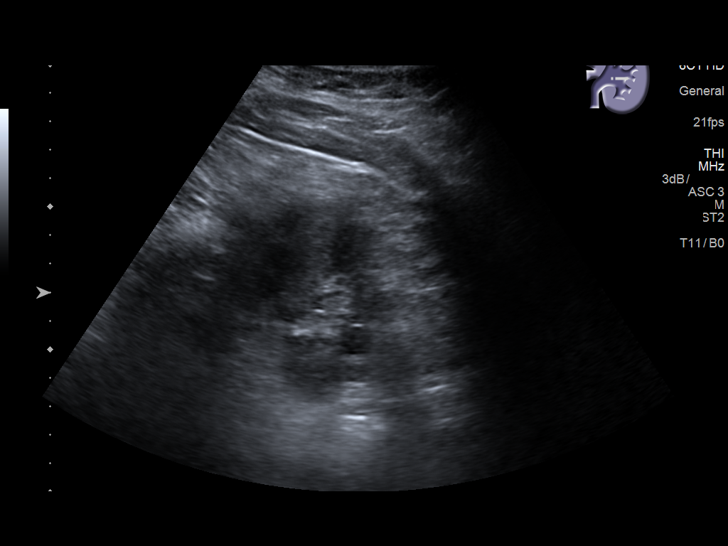
[im 81/89]
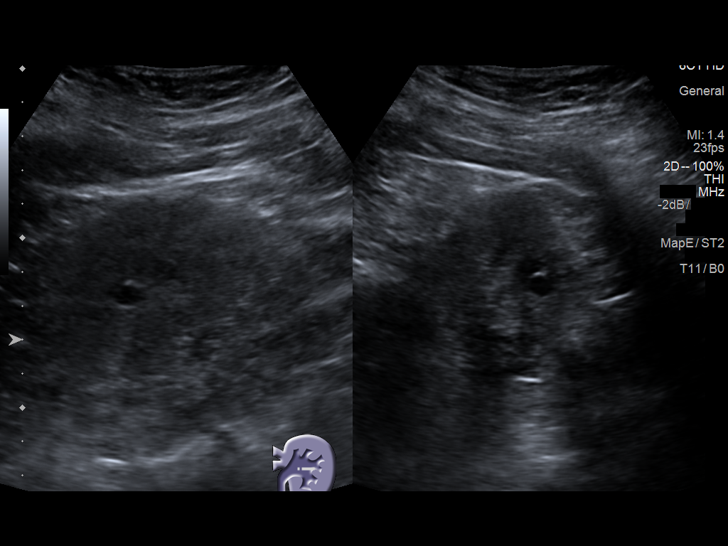
[im 89/89]
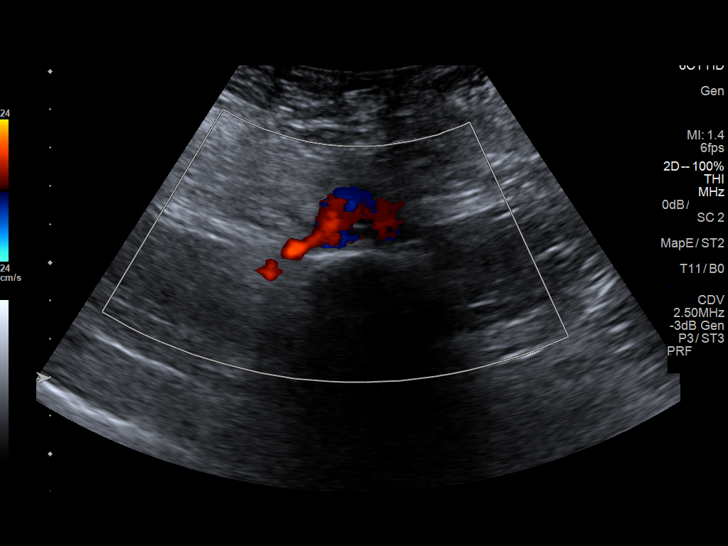

[13 of 25 positions shown; findings below may reference images not displayed]

FINDINGS: Gallbladder: No gallstones or wall thickening visualized. No
sonographic Murphy sign noted by sonographer.

Common bile duct: Diameter: 3.4 mm

Liver: No focal lesion identified. Within normal limits in
parenchymal echogenicity. Portal vein is patent on color Doppler
imaging with normal direction of blood flow towards the liver.

IVC: No abnormality visualized.

Pancreas: Visualized portion unremarkable.

Spleen: Size and appearance within normal limits.

Right Kidney: Length: 11.6 cm. Echogenicity within normal limits. No
mass or hydronephrosis visualized.

Left Kidney: Length: 2.9 cm. No hydronephrosis. Upper pole 1.4 x
x 1.1 cm cyst. Mid aspect 1.1 x 0.8 x 0.8 cm cyst.

Abdominal aorta: Distal aspect partially obscured by bowel gas. No
aneurysm visualized.

Other findings: None.
IMPRESSION: Two left renal cysts, largest 1.4 cm.

Slightly limit evaluation of the distal abdominal aorta/
bifurcations secondary to bowel gas. No aneurysm identified.

No gallstones or gallbladder abnormality.

Remainder of exam unremarkable.
